# Patient Record
Sex: Female | Born: 1975 | Race: Black or African American | Hispanic: No | Marital: Single | Smoking: Current every day smoker
Health system: Southern US, Community
[De-identification: ages and names within clinical notes are randomized; demographics above are authoritative.]

## PROBLEM LIST (undated history)

## (undated) ENCOUNTER — Emergency Department (HOSPITAL_COMMUNITY): Admission: EM | Payer: Medicaid Other | Source: Home / Self Care

## (undated) DIAGNOSIS — J189 Pneumonia, unspecified organism: Secondary | ICD-10-CM

## (undated) DIAGNOSIS — I10 Essential (primary) hypertension: Secondary | ICD-10-CM

## (undated) DIAGNOSIS — J45909 Unspecified asthma, uncomplicated: Secondary | ICD-10-CM

## (undated) HISTORY — PX: KNEE SURGERY: SHX244

---

## 2010-10-10 HISTORY — PX: CHOLECYSTECTOMY: SHX55

## 2017-06-23 ENCOUNTER — Emergency Department (HOSPITAL_COMMUNITY)
Admission: EM | Admit: 2017-06-23 | Discharge: 2017-06-24 | Disposition: A | Discharge status: Home / Self Care | Payer: Self-pay | Attending: Emergency Medicine | Admitting: Emergency Medicine

## 2017-06-23 ENCOUNTER — Encounter (HOSPITAL_COMMUNITY): Payer: Self-pay

## 2017-06-23 DIAGNOSIS — Z3202 Encounter for pregnancy test, result negative: Secondary | ICD-10-CM | POA: Insufficient documentation

## 2017-06-23 DIAGNOSIS — Z79899 Other long term (current) drug therapy: Secondary | ICD-10-CM | POA: Insufficient documentation

## 2017-06-23 DIAGNOSIS — M545 Low back pain, unspecified: Secondary | ICD-10-CM

## 2017-06-23 DIAGNOSIS — Z91040 Latex allergy status: Secondary | ICD-10-CM | POA: Insufficient documentation

## 2017-06-23 DIAGNOSIS — I10 Essential (primary) hypertension: Secondary | ICD-10-CM | POA: Insufficient documentation

## 2017-06-23 DIAGNOSIS — R350 Frequency of micturition: Secondary | ICD-10-CM | POA: Insufficient documentation

## 2017-06-23 DIAGNOSIS — J45909 Unspecified asthma, uncomplicated: Secondary | ICD-10-CM | POA: Insufficient documentation

## 2017-06-23 HISTORY — DX: Unspecified asthma, uncomplicated: J45.909

## 2017-06-23 HISTORY — DX: Essential (primary) hypertension: I10

## 2017-06-23 LAB — URINALYSIS, ROUTINE W REFLEX MICROSCOPIC
BACTERIA UA: NONE SEEN
BILIRUBIN URINE: NEGATIVE
Glucose, UA: NEGATIVE mg/dL
Ketones, ur: NEGATIVE mg/dL
Leukocytes, UA: NEGATIVE
Nitrite: NEGATIVE
Protein, ur: NEGATIVE mg/dL
SPECIFIC GRAVITY, URINE: 1.021 (ref 1.005–1.030)
pH: 5 (ref 5.0–8.0)

## 2017-06-23 LAB — PREGNANCY, URINE: PREG TEST UR: NEGATIVE

## 2017-06-23 NOTE — ED Notes (Signed)
Pt informed urine sample is needed. 

## 2017-06-23 NOTE — ED Triage Notes (Addendum)
Pt reports lower back/bilateral flank pain pain x2 days.Has used tylenol to help with pain with no relief, but none taken today. Pt reports some urinary frequency, denies burning with urination.

## 2017-06-24 ENCOUNTER — Emergency Department (HOSPITAL_COMMUNITY): Payer: Self-pay

## 2017-06-24 LAB — BASIC METABOLIC PANEL
ANION GAP: 6 (ref 5–15)
BUN: 8 mg/dL (ref 6–20)
CALCIUM: 8.8 mg/dL — AB (ref 8.9–10.3)
CO2: 23 mmol/L (ref 22–32)
Chloride: 110 mmol/L (ref 101–111)
Creatinine, Ser: 0.66 mg/dL (ref 0.44–1.00)
Glucose, Bld: 100 mg/dL — ABNORMAL HIGH (ref 65–99)
POTASSIUM: 3.4 mmol/L — AB (ref 3.5–5.1)
Sodium: 139 mmol/L (ref 135–145)

## 2017-06-24 LAB — CBC WITH DIFFERENTIAL/PLATELET
BASOS ABS: 0 10*3/uL (ref 0.0–0.1)
BASOS PCT: 0 %
Eosinophils Absolute: 0.1 10*3/uL (ref 0.0–0.7)
Eosinophils Relative: 2 %
HEMATOCRIT: 33.6 % — AB (ref 36.0–46.0)
HEMOGLOBIN: 11 g/dL — AB (ref 12.0–15.0)
LYMPHS PCT: 59 %
Lymphs Abs: 3.9 10*3/uL (ref 0.7–4.0)
MCH: 23.5 pg — ABNORMAL LOW (ref 26.0–34.0)
MCHC: 32.7 g/dL (ref 30.0–36.0)
MCV: 71.6 fL — AB (ref 78.0–100.0)
Monocytes Absolute: 0.4 10*3/uL (ref 0.1–1.0)
Monocytes Relative: 6 %
NEUTROS ABS: 2.1 10*3/uL (ref 1.7–7.7)
NEUTROS PCT: 33 %
Platelets: 312 10*3/uL (ref 150–400)
RBC: 4.69 MIL/uL (ref 3.87–5.11)
RDW: 18 % — AB (ref 11.5–15.5)
WBC: 6.6 10*3/uL (ref 4.0–10.5)

## 2017-06-24 MED ORDER — CYCLOBENZAPRINE HCL 10 MG PO TABS: 10.0000 mg | ORAL_TABLET | Freq: Two times a day (BID) | ORAL | 0 refills | Status: DC | PRN

## 2017-06-24 MED ORDER — KETOROLAC TROMETHAMINE 30 MG/ML IJ SOLN
15.0000 mg | Freq: Once | INTRAMUSCULAR | Status: AC
  Administered 2017-06-24: 15 mg via INTRAMUSCULAR
  Filled 2017-06-24: qty 1

## 2017-06-24 MED ORDER — NAPROXEN 375 MG PO TABS: 375.0000 mg | ORAL_TABLET | Freq: Two times a day (BID) | ORAL | 0 refills | Status: DC

## 2017-06-24 NOTE — Discharge Instructions (Signed)
Your lab work is reassuring. Your urine does show some blood in it. Unsure of the etiology of this. Will need close follow-up with her PCP and urologist. No signs of infection at this time.  Your back pain may be due to muscle pain. Make sure you follow up with a pcp.   Please take the Naproxen as prescribed for pain. Do not take any additional NSAIDs including Motrin, Aleve, Ibuprofen, Advil.  Please take flexeril for muscle relaxation. This medication will make you drowsy so avoid situation that could place you in danger.

## 2017-06-24 NOTE — ED Notes (Signed)
Pt. Refused for CT scan, PA notified.

## 2017-06-24 NOTE — ED Provider Notes (Signed)
[facility]-EMERGENCY DEPT Provider Note   CSN: [id number] Arrival date & time: 06/23/17  2154     History   Chief Complaint Chief Complaint  Patient presents with  . Back Pain    HPI Sheila Garner is a 41 y.o. female.  HPI 41 year old African-American female past medical history significant for hypertension presents to the emergency department today with complaints of low back pain. The patient states that her pain has been ongoing for "months" but acutely worsened over the past 2-3 days. States the pain is worse with movement. States the pain was only on her left side but now has radiated to her right side. Patient reports some mild increased urinary frequency but denies any dysuria, hematuria, urgency. The patient states that she was seen by her primary care doctor several months ago for same. [place] At that time there referred her to a "kidney specialist". The patient states that she has not followed up with them. The patient states that she does sit a lot for her job. Denies any heavy lifting or known injury. Pt denies any ha, night sweats, hx of ivdu/cancer, loss or bowel or bladder, urinary retention, saddle paresthesias, lower extremity paresthesias. Patient has tried Tylenol for her pain with little relief. Moving makes the pain worse. Nothing makes the pain better. She has not taken any for pain prior to arrival today.  Pt states she does have a history of hematuria.  Pt denies any fever, chill, ha, vision changes, lightheadedness, dizziness, congestion, neck pain, cp, sob, cough, abd pain, n/v/d, vaginal bleeding, vaginal discharge, change in bowel habits, melena, hematochezia, lower extremity paresthesias.   Past Medical History:  Diagnosis Date  . Asthma   . Hypertension     There are no active problems to display for this patient.   Past Surgical History:  Procedure Laterality Date  . CHOLECYSTECTOMY  2012    OB History    No data available        Home Medications    Prior to Admission medications   Medication Sig Start Date End Date Taking? Authorizing Provider  cyclobenzaprine (FLEXERIL) 10 MG tablet Take 1 tablet (10 mg total) by mouth 2 (two) times daily as needed for muscle spasms. 06/24/17   Rise Mu, PA-C  naproxen (NAPROSYN) 375 MG tablet Take 1 tablet (375 mg total) by mouth 2 (two) times daily. 06/24/17   Rise Mu, PA-C    Family History No family history on file.  Social History Social History  Substance Use Topics  . Smoking status: Never Smoker  . Smokeless tobacco: Never Used  . Alcohol use Yes     Comment: socially     Allergies   Latex   Review of Systems Review of Systems  Constitutional: Negative for chills and fever.  HENT: Negative for congestion.   Eyes: Negative for visual disturbance.  Respiratory: Negative for cough and shortness of breath.   Cardiovascular: Negative for chest pain.  Gastrointestinal: Negative for abdominal pain, diarrhea, nausea and vomiting.  Genitourinary: Positive for frequency. Negative for dysuria, flank pain, hematuria, urgency, vaginal bleeding and vaginal discharge.  Musculoskeletal: Positive for back pain. Negative for arthralgias and myalgias.  Skin: Negative for rash.  Neurological: Negative for dizziness, syncope, weakness, light-headedness, numbness and headaches.  Psychiatric/Behavioral: Negative for sleep disturbance. The patient is not nervous/anxious.      Physical Exam Updated Vital Signs BP (!) 142/97 (BP Location: Right Arm)   Pulse 96   Temp  98.1 F (36.7 C) (Oral)   Resp 17   Ht 5' 3.5" (1.613 m)   LMP 06/06/2017 (Approximate) Comment: negative pregnancy test result 06-23-17  SpO2 99%   Physical Exam  Constitutional: She is oriented to person, place, and time. She appears well-developed and well-nourished.  Non-toxic appearance. No distress.  HENT:  Head: Normocephalic and atraumatic.  Nose: Nose normal.   Mouth/Throat: Oropharynx is clear and moist.  Eyes: Pupils are equal, round, and reactive to light. Conjunctivae are normal. Right eye exhibits no discharge. Left eye exhibits no discharge.  Neck: Normal range of motion. Neck supple.  Cardiovascular: Normal rate, regular rhythm, normal heart sounds and intact distal pulses.   Pulmonary/Chest: Effort normal and breath sounds normal. No respiratory distress. She exhibits no tenderness.  Abdominal: Soft. Bowel sounds are normal. There is no tenderness. There is no rigidity, no rebound, no guarding, no CVA tenderness, no tenderness at McBurney's point and negative Murphy's sign.  Musculoskeletal: Normal range of motion. She exhibits no tenderness.  No midline T spine or L spine tenderness. No deformities or step offs noted. Full ROM. Pelvis is stable.  Bilateral lumbar paraspinal tenderness.   Lymphadenopathy:    She has no cervical adenopathy.  Neurological: She is alert and oriented to person, place, and time.  Skin: Skin is warm and dry. Capillary refill takes less than 2 seconds.  Psychiatric: Her behavior is normal. Judgment and thought content normal.  Nursing note and vitals reviewed.    ED Treatments / Results  Labs (all labs ordered are listed, but only abnormal results are displayed) Labs Reviewed  URINALYSIS, ROUTINE W REFLEX MICROSCOPIC - Abnormal; Notable for the following:       Result Value   Hgb urine dipstick LARGE (*)    Squamous Epithelial / LPF 0-5 (*)    All other components within normal limits  BASIC METABOLIC PANEL - Abnormal; Notable for the following:    Potassium 3.4 (*)    Glucose, Bld 100 (*)    Calcium 8.8 (*)    All other components within normal limits  CBC WITH DIFFERENTIAL/PLATELET - Abnormal; Notable for the following:    Hemoglobin 11.0 (*)    HCT 33.6 (*)    MCV 71.6 (*)    MCH 23.5 (*)    RDW 18.0 (*)    All other components within normal limits  URINE CULTURE  PREGNANCY, URINE    EKG   EKG Interpretation None       Radiology No results found.  Procedures Procedures (including critical care time)  Medications Ordered in ED Medications  ketorolac (TORADOL) 30 MG/ML injection 15 mg (15 mg Intramuscular Given 06/24/17 0051)     Initial Impression / Assessment and Plan / ED Course  I have reviewed the triage vital signs and the nursing notes.  Pertinent labs & imaging results that were available during my care of the patient were reviewed by me and considered in my medical decision making (see chart for details).     Patient presents to the emergency Department today with complaints of low back pain. Patient states that her pain has been ongoing for months and she has been seen by her primary care doctor for same in the past. They recommended follow-up with the urologist which she has failed to do so. Patient does report history of hematuria. Patient reports some mild intermittent urinary frequency but denies any specific urinary symptoms at this time. She denies any associated fever, chills, nausea, vomiting, abdominal  pain, vaginal bleeding, vaginal discharge. Patient denies any red flag symptoms. Denies any recent illness.  Overall patient is well-appearing and nontoxic. Vital signs are reassuring. Patient is afebrile, no tachycardia noted. On exam patient does have some mild tenderness to palpation of the lumbar paraspinal region. No midline tenderness. No specific CVA tenderness. Abdominal exam is benign.  Labwork was obtained. No leukocytosis noted. Mild anemia with hemoglobin of 11. Kidney function is normal. Pregnant test is negative. UA shows large rbc, but shows no bacteria, WBCs or leukocytes or nitrite. No protein. We'll send for culture however do not believe this to be treated at this time. Concern for possible nephrolithiasis. Patient states she has history of same.  Have provided patient Toradol in the ED and she feels improved. Patient refused renal CT  scan. States that she will follow-up with her primary care doctor and urologist. The patient's symptoms could also be due to musculoskeletal injury. We'll treat with anti-inflammatories and muscle relaxers. Low suspicion for pyelonephritis, renal infarct.  Encourage patient to have close follow-up with her PCP and neurologist. Given a normal physical exam and reassuring lab work for the patient is safe for discharge at this time.  Pt is hemodynamically stable, in NAD, & able to ambulate in the ED. Evaluation does not show pathology that would require ongoing emergent intervention or inpatient treatment. I explained the diagnosis to the patient. Pain has been managed & has no complaints prior to dc. Pt is comfortable with above plan and is stable for discharge at this time. All questions were answered prior to disposition. Strict return precautions for f/u to the ED were discussed. Encouraged follow up with PCP.    Final Clinical Impressions(s) / ED Diagnoses   Final diagnoses:  Acute bilateral low back pain without sciatica    New Prescriptions Discharge Medication List as of 06/24/2017  1:58 AM    START taking these medications   Details  cyclobenzaprine (FLEXERIL) 10 MG tablet Take 1 tablet (10 mg total) by mouth 2 (two) times daily as needed for muscle spasms., Starting Sat 06/24/2017, Print    naproxen (NAPROSYN) 375 MG tablet Take 1 tablet (375 mg total) by mouth 2 (two) times daily., Starting Sat 06/24/2017, Print         Rise Mu, PA-C 06/24/17 9604    Dione Booze, MD 06/24/17 [phone]

## 2017-06-26 LAB — URINE CULTURE: CULTURE: NO GROWTH

## 2017-09-01 ENCOUNTER — Other Ambulatory Visit: Payer: Self-pay

## 2017-09-01 ENCOUNTER — Encounter (HOSPITAL_COMMUNITY): Payer: Self-pay

## 2017-09-01 ENCOUNTER — Emergency Department (HOSPITAL_COMMUNITY)
Admission: EM | Admit: 2017-09-01 | Discharge: 2017-09-01 | Disposition: A | Discharge status: Home / Self Care | Payer: Medicaid Other | Attending: Emergency Medicine | Admitting: Emergency Medicine

## 2017-09-01 DIAGNOSIS — Z91040 Latex allergy status: Secondary | ICD-10-CM | POA: Insufficient documentation

## 2017-09-01 DIAGNOSIS — J029 Acute pharyngitis, unspecified: Secondary | ICD-10-CM | POA: Diagnosis not present

## 2017-09-01 DIAGNOSIS — Z9049 Acquired absence of other specified parts of digestive tract: Secondary | ICD-10-CM | POA: Diagnosis not present

## 2017-09-01 DIAGNOSIS — I10 Essential (primary) hypertension: Secondary | ICD-10-CM | POA: Insufficient documentation

## 2017-09-01 DIAGNOSIS — Z79899 Other long term (current) drug therapy: Secondary | ICD-10-CM | POA: Insufficient documentation

## 2017-09-01 DIAGNOSIS — F17210 Nicotine dependence, cigarettes, uncomplicated: Secondary | ICD-10-CM | POA: Insufficient documentation

## 2017-09-01 DIAGNOSIS — J45909 Unspecified asthma, uncomplicated: Secondary | ICD-10-CM | POA: Diagnosis not present

## 2017-09-01 LAB — RAPID STREP SCREEN (MED CTR MEBANE ONLY): Streptococcus, Group A Screen (Direct): NEGATIVE

## 2017-09-01 NOTE — ED Triage Notes (Signed)
Pt reports she has been having sore throat, headache, and body aches X3 days. She reports using Theraflu and tylenol which stopped chills and aches but states she still has a sore throat with Popovich spots.

## 2017-09-01 NOTE — Discharge Instructions (Signed)
Please read attached information. If you experience any new or worsening signs or symptoms please return to the emergency room for evaluation. Please follow-up with your primary care provider or specialist as discussed.  °

## 2017-09-01 NOTE — ED Provider Notes (Signed)
[facility]   CSN: [id number] Arrival date & time: 09/01/17  0932     History   Chief Complaint Chief Complaint  Patient presents with  . Sore Throat    HPI Sheila DupontMelinda Crosby is a 41 y.o. female.  HPI   41 year old female presents today with complaints of sore throat.  Patient reports 3 days ago she developed body aches, sore throat and fatigue.  She notes taking TheraFlu which seemed to improve her symptoms, denies any fever at that time.  Patient notes continued sore throat with "[place] spots" on her tonsils.  Patient denies any productive cough, denies any fever, denies any other systemic symptoms.  Patient has been using cough drops at home without significant improvement.  Past Medical History:  Diagnosis Date  . Asthma   . Hypertension     There are no active problems to display for this patient.   Past Surgical History:  Procedure Laterality Date  . CHOLECYSTECTOMY  2012    OB History    No data available       Home Medications    Prior to Admission medications   Medication Sig Start Date End Date Taking? Authorizing Provider  cyclobenzaprine (FLEXERIL) 10 MG tablet Take 1 tablet (10 mg total) by mouth 2 (two) times daily as needed for muscle spasms. 06/24/17   Rise MuLeaphart, Kenneth T, PA-C  naproxen (NAPROSYN) 375 MG tablet Take 1 tablet (375 mg total) by mouth 2 (two) times daily. 06/24/17   Rise MuLeaphart, Kenneth T, PA-C    Family History No family history on file.  Social History Social History   Tobacco Use  . Smoking status: Current Every Day Smoker    Packs/day: 0.50    Years: 15.00    Pack years: 7.50    Types: Cigarettes  . Smokeless tobacco: Never Used  Substance Use Topics  . Alcohol use: Yes    Comment: socially  . Drug use: No     Allergies   Latex   Review of Systems Review of Systems  All other systems reviewed and are negative.  Physical Exam Updated Vital Signs BP (!) 157/106  (BP Location: Right Arm)   Pulse 79   Temp 98.5 F (36.9 C) (Oral)   Resp 16   Ht 5\' 4"  (1.626 m)   Wt 77.1 kg (170 lb)   LMP 08/18/2017   SpO2 100%   BMI 29.18 kg/m   Physical Exam  Constitutional: She is oriented to person, place, and time. She appears well-developed and well-nourished.  HENT:  Head: Normocephalic and atraumatic.  Mouth/Throat: Uvula is midline, oropharynx is clear and moist and mucous membranes are normal. No oropharyngeal exudate, posterior oropharyngeal edema or posterior oropharyngeal erythema. Tonsils are 0 on the right. Tonsils are 0 on the left. No tonsillar exudate.  No tender lymphadenopathy  Eyes: Conjunctivae are normal. Pupils are equal, round, and reactive to light. Right eye exhibits no discharge. Left eye exhibits no discharge. No scleral icterus.  Neck: Normal range of motion. No JVD present. No tracheal deviation present.  Pulmonary/Chest: Effort normal. No stridor.  Neurological: She is alert and oriented to person, place, and time. Coordination normal.  Psychiatric: She has a normal mood and affect. Her behavior is normal. Judgment and thought content normal.  Nursing note and vitals reviewed.    ED Treatments / Results  Labs (all labs ordered are listed, but only abnormal results are displayed) Labs Reviewed  RAPID STREP SCREEN (NOT AT  [facility])  CULTURE, GROUP A STREP [facility])    EKG  EKG Interpretation None       Radiology No results found.  Procedures Procedures (including critical care time)  Medications Ordered in ED Medications - No data to display   Initial Impression / Assessment and Plan / ED Course  I have reviewed the triage vital signs and the nursing notes.  Pertinent labs & imaging results that were available during my care of the patient were reviewed by me and considered in my medical decision making (see chart for details).     Final Clinical Impressions(s) / ED Diagnoses   Final diagnoses:  Pharyngitis,  unspecified etiology    Labs: rapid Strep  Imaging:  Consults:  Therapeutics:  Discharge Meds:   Assessment/Plan: 41 year old female presents today with likely viral pharyngitis.  She has a very reassuring workup.  Negative strep, no signs of infectious etiology within the oropharynx.  Patient is afebrile tolerating p.o. with no signs of significant infection.  She will be discharged with symptomatic care instructions and strict return precautions.  She verbalized understanding and agreement to today's plan.    ED Discharge Orders    None       Rosalio LoudHedges, Zoii Florer, PA-C 09/01/17 1157    Raeford RazorKohut, Stephen, MD 09/01/17 [phone]

## 2017-09-03 LAB — CULTURE, GROUP A STREP (THRC)

## 2017-09-06 ENCOUNTER — Other Ambulatory Visit: Payer: Self-pay

## 2017-09-06 ENCOUNTER — Encounter (HOSPITAL_COMMUNITY): Payer: Self-pay | Admitting: Emergency Medicine

## 2017-09-06 ENCOUNTER — Ambulatory Visit (HOSPITAL_COMMUNITY)
Admission: EM | Admit: 2017-09-06 | Discharge: 2017-09-06 | Disposition: A | Discharge status: Home / Self Care | Payer: Medicaid Other | Attending: Family Medicine | Admitting: Family Medicine

## 2017-09-06 DIAGNOSIS — J0100 Acute maxillary sinusitis, unspecified: Secondary | ICD-10-CM | POA: Diagnosis not present

## 2017-09-06 DIAGNOSIS — R059 Cough, unspecified: Secondary | ICD-10-CM

## 2017-09-06 DIAGNOSIS — R05 Cough: Secondary | ICD-10-CM | POA: Diagnosis not present

## 2017-09-06 DIAGNOSIS — I10 Essential (primary) hypertension: Secondary | ICD-10-CM

## 2017-09-06 MED ORDER — LISINOPRIL 10 MG PO TABS: 10.0000 mg | ORAL_TABLET | Freq: Every day | ORAL | 1 refills | Status: DC

## 2017-09-06 MED ORDER — FLUCONAZOLE 150 MG PO TABS: 150.0000 mg | ORAL_TABLET | Freq: Once | ORAL | 0 refills | Status: AC

## 2017-09-06 MED ORDER — BENZONATATE 100 MG PO CAPS: 100.0000 mg | ORAL_CAPSULE | Freq: Three times a day (TID) | ORAL | 0 refills | Status: DC | PRN

## 2017-09-06 MED ORDER — AMOXICILLIN 875 MG PO TABS: 875.0000 mg | ORAL_TABLET | Freq: Two times a day (BID) | ORAL | 0 refills | Status: DC

## 2017-09-06 NOTE — Discharge Instructions (Signed)
[facility] Contact:  [employer] at [phone]

## 2017-09-06 NOTE — ED Triage Notes (Addendum)
Pt reports a sore throat, sinus congestion, cough and right ear pain for one week.  Pt was seen in the ED 5 days ago and she had a negative Strep Culture.  No medications were given.  Pt would like a refill on her Lisinopril 10 mg Daily.

## 2017-09-06 NOTE — ED Provider Notes (Addendum)
[facility]   [id number] 09/06/17 Arrival Time: 1300   SUBJECTIVE:  Terrence DupontMelinda Krage is a 41 y.o. female who presents to the urgent care with complaint of sore throat, sinus congestion, cough and right ear pain for one week. Pt was seen in the ED 5 days ago and she had a negative Strep Culture. No medications were given.  Pt would like a refill on her Lisinopril 10 mg Daily.  No chest pain.  No shortness of breath.  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 Asthma   . Hypertension    Family History  Problem Relation Age of Onset  . Heart failure Mother   . Diabetes Mother   . Hypertension Mother   . Diabetes Father   . Hypertension Father   . Stroke Father    Social History   Socioeconomic History  . Marital status: Single    Spouse name: Not on file  . Number of children: Not on file  . Years of education: Not on file  . Highest education level: Not on file  Social Needs  . Financial resource strain: Not on file  . Food insecurity - worry: Not on file  . Food insecurity - inability: Not on file  . Transportation needs - medical: Not on file  . Transportation needs - non-medical: Not on file  Occupational History  . Not on file  Tobacco Use  . Smoking status: Current Every Day Smoker    Packs/day: 0.25    Years: 15.00    Pack years: 3.75    Types: Cigarettes  . Smokeless tobacco: Never Used  Substance and Sexual Activity  . Alcohol use: Yes    Comment: socially  . Drug use: No  . Sexual activity: No  Other Topics Concern  . Not on file  Social History Narrative  . Not on file   Current Meds  Medication Sig  . [DISCONTINUED] lisinopril (PRINIVIL,ZESTRIL) 10 MG tablet Take 10 mg by mouth daily.   Allergies  Allergen Reactions  . Latex Rash      ROS: As per HPI, remainder of ROS negative.   OBJECTIVE:   Vitals:   09/06/17 1318  BP: (!) 153/101  Pulse: 88  Temp: 98.2 F  (36.8 C)  TempSrc: Oral  SpO2: 100%     General appearance: alert; no distress Eyes: PERRL; EOMI; conjunctiva normal HENT: normocephalic; atraumatic; TMs normal, canal normal, external ears normal without trauma; nasal mucosa normal; oral mucosa normal Neck: supple Lungs: clear to auscultation bilaterally Heart: regular rate and rhythm Back: no CVA tenderness Extremities: no cyanosis or edema; symmetrical with no gross deformities Skin: warm and dry Neurologic: normal gait; grossly normal Psychological: alert and cooperative; normal mood and affect      Labs:  Results for orders placed or performed during the hospital encounter of 09/01/17  Rapid strep screen  Result Value Ref Range   Streptococcus, Group A Screen (Direct) NEGATIVE NEGATIVE  Culture, group A strep  Result Value Ref Range   Specimen Description THROAT    Special Requests NONE Reflexed from [id number]    Culture NO GROUP A STREP (S.PYOGENES) ISOLATED    Report Status 09/03/2017 FINAL     Labs Reviewed - No data to display  No results found.     ASSESSMENT & PLAN:  1. Acute maxillary sinusitis, recurrence not specified   2. Essential hypertension   3. Cough  Meds ordered this encounter  Medications  . benzonatate (TESSALON) 100 MG capsule    Sig: Take 1-2 capsules (100-200 mg total) by mouth 3 (three) times daily as needed for cough.    Dispense:  20 capsule    Refill:  0  . amoxicillin (AMOXIL) 875 MG tablet    Sig: Take 1 tablet (875 mg total) by mouth 2 (two) times daily.    Dispense:  20 tablet    Refill:  0  . lisinopril (PRINIVIL,ZESTRIL) 10 MG tablet    Sig: Take 1 tablet (10 mg total) by mouth daily.    Dispense:  90 tablet    Refill:  1  . fluconazole (DIFLUCAN) 150 MG tablet    Sig: Take 1 tablet (150 mg total) by mouth once for 1 dose. Repeat if needed    Dispense:  2 tablet    Refill:  0    Reviewed expectations re: course of current medical issues. Questions  answered. Outlined signs and symptoms indicating need for more acute intervention. Patient verbalized understanding. After Visit Summary given.    Procedures:      Elvina SidleLauenstein, Karysa Heft, MD 09/06/17 1341    Elvina SidleLauenstein, Timberlyn Pickford, MD 09/06/17 1350

## 2018-04-03 ENCOUNTER — Ambulatory Visit (INDEPENDENT_AMBULATORY_CARE_PROVIDER_SITE_OTHER): Payer: No Typology Code available for payment source | Admitting: Orthopaedic Surgery

## 2018-04-03 ENCOUNTER — Telehealth (INDEPENDENT_AMBULATORY_CARE_PROVIDER_SITE_OTHER): Payer: Self-pay

## 2018-04-03 DIAGNOSIS — S63656A Sprain of metacarpophalangeal joint of right little finger, initial encounter: Secondary | ICD-10-CM

## 2018-04-03 MED ORDER — MELOXICAM 7.5 MG PO TABS: 7.5000 mg | ORAL_TABLET | Freq: Every day | ORAL | 2 refills | Status: DC | PRN
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
## 2018-04-03 NOTE — Telephone Encounter (Signed)
Please see the order placed in the referral queue for physical therapy for this worker's comp patient.

## 2018-04-04 NOTE — Telephone Encounter (Signed)
Faxed PT rx and yesterdays office note to w[facility] adj Wilhemina CashKymberle Wachter Fax#[phone]  See referral

## 2018-04-06 ENCOUNTER — Telehealth (INDEPENDENT_AMBULATORY_CARE_PROVIDER_SITE_OTHER): Payer: Self-pay | Admitting: Physician Assistant

## 2018-04-06 NOTE — Telephone Encounter (Signed)
OV note faxed to nurse case manager Amil AmenJulie Mullins @ [phone]

## 2018-04-06 NOTE — Telephone Encounter (Signed)
Amil AmenJulie Mullins, Nurse Case Manager is needing the work status. Please fax note to [phone].

## 2018-04-09 ENCOUNTER — Encounter: Payer: Self-pay | Admitting: Emergency Medicine

## 2018-04-09 ENCOUNTER — Emergency Department
Admission: EM | Admit: 2018-04-09 | Discharge: 2018-04-10 | Disposition: A | Discharge status: Home / Self Care | Payer: Medicaid Other | Attending: Emergency Medicine | Admitting: Emergency Medicine

## 2018-04-09 DIAGNOSIS — Z79899 Other long term (current) drug therapy: Secondary | ICD-10-CM | POA: Diagnosis not present

## 2018-04-09 DIAGNOSIS — I10 Essential (primary) hypertension: Secondary | ICD-10-CM | POA: Diagnosis not present

## 2018-04-09 DIAGNOSIS — Z9049 Acquired absence of other specified parts of digestive tract: Secondary | ICD-10-CM | POA: Diagnosis not present

## 2018-04-09 DIAGNOSIS — F17210 Nicotine dependence, cigarettes, uncomplicated: Secondary | ICD-10-CM | POA: Diagnosis not present

## 2018-04-09 DIAGNOSIS — R51 Headache: Secondary | ICD-10-CM | POA: Diagnosis present

## 2018-04-09 DIAGNOSIS — Z91040 Latex allergy status: Secondary | ICD-10-CM | POA: Diagnosis not present

## 2018-04-09 DIAGNOSIS — G43809 Other migraine, not intractable, without status migrainosus: Secondary | ICD-10-CM | POA: Diagnosis not present

## 2018-04-09 DIAGNOSIS — J45909 Unspecified asthma, uncomplicated: Secondary | ICD-10-CM | POA: Diagnosis not present

## 2018-04-09 MED ORDER — DIPHENHYDRAMINE HCL 50 MG/ML IJ SOLN
25.0000 mg | Freq: Once | INTRAMUSCULAR | Status: AC
  Administered 2018-04-10: 25 mg via INTRAVENOUS
  Filled 2018-04-09: qty 1

## 2018-04-09 MED ORDER — METOCLOPRAMIDE HCL 5 MG/ML IJ SOLN
10.0000 mg | Freq: Once | INTRAMUSCULAR | Status: AC
  Administered 2018-04-10: 10 mg via INTRAVENOUS
  Filled 2018-04-09: qty 2

## 2018-04-09 MED ORDER — SUMATRIPTAN SUCCINATE 6 MG/0.5ML ~~LOC~~ SOLN
6.0000 mg | Freq: Once | SUBCUTANEOUS | Status: AC
  Administered 2018-04-10: 6 mg via SUBCUTANEOUS
  Filled 2018-04-09: qty 0.5

## 2018-04-09 MED ORDER — KETOROLAC TROMETHAMINE 30 MG/ML IJ SOLN
30.0000 mg | Freq: Once | INTRAMUSCULAR | Status: AC
  Administered 2018-04-10: 30 mg via INTRAVENOUS
  Filled 2018-04-09: qty 1

## 2018-04-09 NOTE — Telephone Encounter (Signed)
What is patient's work status?  

## 2018-04-09 NOTE — ED Notes (Signed)
Pt reports migraine that started this morning. Reports last migraine was 6 months ago. VSS. Pt denies CP or SOB.

## 2018-04-09 NOTE — ED Triage Notes (Signed)
Pt c/o migraine since this AM. Pt has hx/o and stopped taking medication 6 months ago since pt had ear piercing that has relieved symptoms until today.

## 2018-04-09 NOTE — ED Provider Notes (Signed)
[facility] Emergency Department Provider Note   First MD Initiated Contact with Patient 04/09/18 2344     (approximate)  I have reviewed the triage vital signs and the nursing notes.   HISTORY  Chief Complaint Migraine    HPI Sheila Garner is a 42 y.o. female with history of migraines presents to the emergency department with a history of migraine consistent with previous migraine headaches x2 hours.  Patient denies any weakness numbness gait instability or visual changes.  Patient states current pain score is 10 out of 10.  Patient states positive nausea and photophobia.  Past Medical History:  Diagnosis Date  . Asthma   . Hypertension     Patient Active Problem List   Diagnosis Date Noted  . Sprain of metacarpophalangeal joint of right little finger 04/03/2018    Past Surgical History:  Procedure Laterality Date  . CHOLECYSTECTOMY  2012  . KNEE SURGERY Left     Prior to Admission medications   Medication Sig Start Date End Date Taking? Authorizing Provider  amoxicillin (AMOXIL) 875 MG tablet Take 1 tablet (875 mg total) by mouth 2 (two) times daily. 09/06/17   Elvina Sidle, MD  benzonatate (TESSALON) 100 MG capsule Take 1-2 capsules (100-200 mg total) by mouth 3 (three) times daily as needed for cough. 09/06/17   Elvina Sidle, MD  lisinopril (PRINIVIL,ZESTRIL) 10 MG tablet Take 1 tablet (10 mg total) by mouth daily. 09/06/17   Elvina Sidle, MD  meloxicam (MOBIC) 7.5 MG tablet TK 1 T PO BID 03/02/18   [provider]  meloxicam (MOBIC) 7.5 MG tablet Take 1 tablet (7.5 mg total) by mouth daily as needed for up to 30 doses for pain. 04/03/18   Cristie Hem, PA-C  ondansetron (ZOFRAN-ODT) 4 MG disintegrating tablet Take by mouth. 10/08/16   [provider]    Allergies Latex  Family History  Problem Relation Age of Onset  . Heart failure Mother   . Diabetes Mother   . Hypertension Mother   . Diabetes Father    . Hypertension Father   . Stroke Father     Social History Social History   Tobacco Use  . Smoking status: Current Every Day Smoker    Packs/day: 0.25    Years: 15.00    Pack years: 3.75    Types: Cigarettes  . Smokeless tobacco: Never Used  Substance Use Topics  . Alcohol use: Yes    Comment: socially  . Drug use: No    Review of Systems Constitutional: No fever/chills Eyes: No visual changes. ENT: No sore throat. Cardiovascular: Denies chest pain. Respiratory: Denies shortness of breath. Gastrointestinal: No abdominal pain.  No nausea, no vomiting.  No diarrhea.  No constipation. Genitourinary: Negative for dysuria. Musculoskeletal: Negative for neck pain.  Negative for back pain. Integumentary: Negative for rash. Neurological: Positive or headaches, negative focal weakness or numbness.   ____________________________________________   PHYSICAL EXAM:  VITAL SIGNS: ED Triage Vitals [04/09/18 2243]  Enc Vitals Group     BP (!) 158/95     Pulse Rate 86     Resp 17     Temp 98 F (36.7 C)     Temp Source Oral     SpO2 100 %     Weight 77.1 kg (170 lb)     Height      Head Circumference      Peak Flow      Pain Score 8     Pain  Loc      Pain Edu?      Excl. in GC?     Constitutional: Alert and oriented. Well appearing and in no acute distress. Eyes: Conjunctivae are normal. PERRL. EOMI. Head: Atraumatic. Mouth/Throat: Mucous membranes are moist.{**  Oropharynx non-erythematous. Neck: No stridor.   Cardiovascular: Normal rate, regular rhythm. Good peripheral circulation. Grossly normal heart sounds. Respiratory: Normal respiratory effort.  No retractions. Lungs CTAB. Gastrointestinal: Soft and nontender. No distention.  Musculoskeletal: No lower extremity tenderness nor edema. No gross deformities of extremities. Neurologic:  Normal speech and language. No gross focal neurologic deficits are appreciated.  Skin:  Skin is warm, dry and intact. No rash  noted. Psychiatric: Mood and affect are normal. Speech and behavior are normal.     Procedures   ____________________________________________   INITIAL IMPRESSION / ASSESSMENT AND PLAN / ED COURSE  As part of my medical decision making, I reviewed the following data within the electronic MEDICAL RECORD NUMBER   42 year old female presenting with above-stated history physical exam secondary to migraine headache.  Patient given IV Toradol Benadryl and Reglan in addition patient given Imitrex 6 mg subcu.  On reevaluation patient states that pain is much improved.  Patient states that she was prescribed Maxalt and Topamax but has ran out and as such these prescriptions were given recommendation for follow-up. ____________________________________________  FINAL CLINICAL IMPRESSION(S) / ED DIAGNOSES  Final diagnoses:  Other migraine without status migrainosus, not intractable     MEDICATIONS GIVEN DURING THIS VISIT:  Medications - No data to display   ED Discharge Orders    None       Note:  This document was prepared using Dragon voice recognition software and may include unintentional dictation errors.    Darci CurrentBrown, [place] N, MD 04/10/18 [phone]

## 2018-04-09 NOTE — Telephone Encounter (Signed)
No repetitive use of the hand.  No lifting more than 5 lbs

## 2018-04-10 ENCOUNTER — Encounter (INDEPENDENT_AMBULATORY_CARE_PROVIDER_SITE_OTHER): Payer: Self-pay

## 2018-04-10 MED ORDER — RIZATRIPTAN BENZOATE 10 MG PO TABS: 10.0000 mg | ORAL_TABLET | Freq: Once | ORAL | 0 refills | Status: DC | PRN

## 2018-04-10 MED ORDER — TOPIRAMATE 25 MG PO TABS: 25.0000 mg | ORAL_TABLET | Freq: Every day | ORAL | 0 refills | Status: DC

## 2018-04-10 NOTE — Telephone Encounter (Signed)
Note made and faxed to Amil AmenJulie Mullins

## 2018-04-11 ENCOUNTER — Telehealth (INDEPENDENT_AMBULATORY_CARE_PROVIDER_SITE_OTHER): Payer: Self-pay | Admitting: Orthopaedic Surgery

## 2018-04-11 NOTE — Telephone Encounter (Signed)
FAXED

## 2018-04-11 NOTE — Telephone Encounter (Signed)
Beth from [facility] called requesting recent office notes to be faxed to:  [phone]

## 2018-04-25 ENCOUNTER — Telehealth (INDEPENDENT_AMBULATORY_CARE_PROVIDER_SITE_OTHER): Payer: Self-pay | Admitting: Orthopaedic Surgery

## 2018-04-25 NOTE — Telephone Encounter (Signed)
Beth from [facility] called needing order faxed to them for (OT) to eval and treat patient. The fax# is [phone]  The phone # is [phone]

## 2018-04-25 NOTE — Telephone Encounter (Signed)
[facility]

## 2018-05-08 ENCOUNTER — Ambulatory Visit (INDEPENDENT_AMBULATORY_CARE_PROVIDER_SITE_OTHER): Payer: No Typology Code available for payment source

## 2018-05-08 ENCOUNTER — Ambulatory Visit (INDEPENDENT_AMBULATORY_CARE_PROVIDER_SITE_OTHER): Payer: No Typology Code available for payment source | Admitting: Orthopaedic Surgery

## 2018-05-08 ENCOUNTER — Encounter (INDEPENDENT_AMBULATORY_CARE_PROVIDER_SITE_OTHER): Payer: Self-pay | Admitting: Orthopaedic Surgery

## 2018-05-08 DIAGNOSIS — S63657A Sprain of metacarpophalangeal joint of left little finger, initial encounter: Secondary | ICD-10-CM

## 2018-05-08 DIAGNOSIS — S63656A Sprain of metacarpophalangeal joint of right little finger, initial encounter: Secondary | ICD-10-CM

## 2018-05-08 NOTE — Progress Notes (Signed)
   Office Visit Note   Patient: Sheila Garner           Date of Birth: [birth year]           MRN: [id number] Visit Date: 05/08/2018              Requested by: No referring provider defined for this encounter. PCP: Patient, No Pcp Per   Assessment & Plan: Visit Diagnoses:  1. Sprain of metacarpophalangeal (MCP) joint of right little finger, initial encounter     Plan: Impression is improving left small finger MCP sprain.  She is doing well at work she is able to do her full duty.  I recommend continue with hand therapy as she is making progress and she is improving.  From my standpoint she can continue working.  I will see her back in another 4 weeks for recheck.  Follow-Up Instructions: Return in about 1 month (around 06/05/2018).   Orders:  Orders Placed This Encounter  Procedures  . XR Hand Complete Left   No orders of the defined types were placed in this encounter.     Procedures: No procedures performed   Clinical Data: No additional findings.   Subjective: Chief Complaint  Patient presents with  . Left Hand - Pain, Follow-up     Today for her left hand pain.  She is overall doing well.  She is currently working as a Naval architecttruck driver.  Tylenol helps with the pain.   Review of Systems   Objective: Vital Signs: There were no vitals taken for this visit.  Physical Exam  Ortho Exam Left hand exam shows no swelling or palpable defects.  She is able to make a full composite fist.   Specialty Comments:  No specialty comments available.  Imaging: Xr Hand Complete Left  Result Date: 05/08/2018 No acute or structural abnormalities.    PMFS History: Patient Active Problem List   Diagnosis Date Noted  . Sprain of metacarpophalangeal joint of right little finger 04/03/2018   Past Medical History:  Diagnosis Date  . Asthma   . Hypertension     Family History  Problem Relation Age of Onset  . Heart failure Mother   . Diabetes Mother   . Hypertension  Mother   . Diabetes Father   . Hypertension Father   . Stroke Father     Past Surgical History:  Procedure Laterality Date  . CHOLECYSTECTOMY  2012  . KNEE SURGERY Left    Social History   Occupational History  . Not on file  Tobacco Use  . Smoking status: Current Every Day Smoker    Packs/day: 0.25    Years: 15.00    Pack years: 3.75    Types: Cigarettes  . Smokeless tobacco: Never Used  Substance and Sexual Activity  . Alcohol use: Yes    Comment: socially  . Drug use: No  . Sexual activity: Never

## 2018-05-08 NOTE — Progress Notes (Signed)
   Office Visit Note   Patient: Sheila DupontMelinda Hieronymus           Date of Birth: 12-13-1975           MRN: [id number] Visit Date: 05/08/2018              Requested by: No referring provider defined for this encounter. PCP: Patient, No Pcp Per   Assessment & Plan: Visit Diagnoses:  1. Sprain of metacarpophalangeal (MCP) joint of right little finger, initial encounter     Plan: 5th MCP joint sprain. Progressing appropriately at this time with hand therapy. She will continue with hand therapy for 4 more weeks and follow up at that time in clinic. She may continue to work without restrictions.  Follow-Up Instructions: Return in about 1 month (around 06/05/2018).   Orders:  Orders Placed This Encounter  Procedures  . XR Hand Complete Left   No orders of the defined types were placed in this encounter.     Procedures: No procedures performed   Clinical Data: No additional findings.   Subjective: Chief Complaint  Patient presents with  . Left Hand - Pain, Follow-up    HPI  42 year old female here for follow-up for sprain of her fifth MCP.  She has continued discomfort states she feels like there is something between her fourth and fifth metacarpal.  She has been having therapy and per their note has been progressing well.  Patient feels like she is doing well with therapy as well.  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  She was taking Mobic that she had not found beneficial.  However, she takes Tylenol which she feels helps.  She denies any erythema, bruising, numbness or tingling.  Review of Systems See HPI  Objective: Vital Signs: There were no vitals taken for this visit.  Physical Exam  GEN: awake, alert, NAD  Left Hand: Inspection: No obvious deformity. No swelling, erythema or bruising Palpation: Tenderness over fifth MCP and along the fifth metacarpal. No ligamentous laxity on exam ROM: Full ROM of the digits and wrist Strength: 5/5 strength in  the forearm, wrist and interosseus muscles Neurovascular: NV intact    Specialty Comments:  No specialty comments available.  Imaging: No results found.   PMFS History: Patient Active Problem List   Diagnosis Date Noted  . Sprain of metacarpophalangeal joint of right little finger 04/03/2018   Past Medical History:  Diagnosis Date  . Asthma   . Hypertension     Family History  Problem Relation Age of Onset  . Heart failure Mother   . Diabetes Mother   . Hypertension Mother   . Diabetes Father   . Hypertension Father   . Stroke Father     Past Surgical History:  Procedure Laterality Date  . CHOLECYSTECTOMY  2012  . KNEE SURGERY Left    Social History   Occupational History  . Not on file  Tobacco Use  . Smoking status: Current Every Day Smoker    Packs/day: 0.25    Years: 15.00    Pack years: 3.75    Types: Cigarettes  . Smokeless tobacco: Never Used  Substance and Sexual Activity  . Alcohol use: Yes    Comment: socially  . Drug use: No  . Sexual activity: Never

## 2018-05-14 ENCOUNTER — Telehealth (INDEPENDENT_AMBULATORY_CARE_PROVIDER_SITE_OTHER): Payer: Self-pay

## 2018-05-14 NOTE — Telephone Encounter (Signed)
Faxed 05/08/18 office and work note to case mgr per her request

## 2018-05-17 ENCOUNTER — Telehealth (INDEPENDENT_AMBULATORY_CARE_PROVIDER_SITE_OTHER): Payer: Self-pay | Admitting: Orthopaedic Surgery

## 2018-05-17 NOTE — Telephone Encounter (Signed)
Returned call to Sheila FanningJulie mullins(nurse case Production designer, theatre/television/filmmanager) with [facility] left message to return call. Advised patient do not have a scheduled f/u appointment  [phone]

## 2018-06-08 ENCOUNTER — Ambulatory Visit (INDEPENDENT_AMBULATORY_CARE_PROVIDER_SITE_OTHER): Payer: No Typology Code available for payment source | Admitting: Orthopaedic Surgery

## 2018-06-12 ENCOUNTER — Telehealth (INDEPENDENT_AMBULATORY_CARE_PROVIDER_SITE_OTHER): Payer: Self-pay | Admitting: Orthopaedic Surgery

## 2018-06-12 NOTE — Telephone Encounter (Signed)
Returned call to patient left message to return call to reschedule appointment  [phone]

## 2018-06-18 ENCOUNTER — Ambulatory Visit (HOSPITAL_COMMUNITY)
Admission: EM | Admit: 2018-06-18 | Discharge: 2018-06-18 | Disposition: A | Discharge status: Home / Self Care | Payer: Medicaid Other | Attending: Urgent Care | Admitting: Urgent Care

## 2018-06-18 ENCOUNTER — Encounter (HOSPITAL_COMMUNITY): Payer: Self-pay | Admitting: Emergency Medicine

## 2018-06-18 DIAGNOSIS — H53143 Visual discomfort, bilateral: Secondary | ICD-10-CM | POA: Diagnosis not present

## 2018-06-18 DIAGNOSIS — J019 Acute sinusitis, unspecified: Secondary | ICD-10-CM

## 2018-06-18 DIAGNOSIS — R030 Elevated blood-pressure reading, without diagnosis of hypertension: Secondary | ICD-10-CM

## 2018-06-18 DIAGNOSIS — I10 Essential (primary) hypertension: Secondary | ICD-10-CM | POA: Diagnosis not present

## 2018-06-18 DIAGNOSIS — R519 Headache, unspecified: Secondary | ICD-10-CM

## 2018-06-18 DIAGNOSIS — H53149 Visual discomfort, unspecified: Secondary | ICD-10-CM

## 2018-06-18 DIAGNOSIS — Z8669 Personal history of other diseases of the nervous system and sense organs: Secondary | ICD-10-CM

## 2018-06-18 DIAGNOSIS — T7840XA Allergy, unspecified, initial encounter: Secondary | ICD-10-CM | POA: Diagnosis not present

## 2018-06-18 DIAGNOSIS — R51 Headache: Secondary | ICD-10-CM | POA: Diagnosis not present

## 2018-06-18 DIAGNOSIS — Z9109 Other allergy status, other than to drugs and biological substances: Secondary | ICD-10-CM

## 2018-06-18 LAB — POCT I-STAT, CHEM 8
BUN: 7 mg/dL (ref 6–20)
CALCIUM ION: 1.22 mmol/L (ref 1.15–1.40)
CHLORIDE: 105 mmol/L (ref 98–111)
Creatinine, Ser: 0.7 mg/dL (ref 0.44–1.00)
Glucose, Bld: 79 mg/dL (ref 70–99)
HCT: 40 % (ref 36.0–46.0)
Hemoglobin: 13.6 g/dL (ref 12.0–15.0)
POTASSIUM: 3.7 mmol/L (ref 3.5–5.1)
SODIUM: 141 mmol/L (ref 135–145)
TCO2: 23 mmol/L (ref 22–32)

## 2018-06-18 MED ORDER — FLUCONAZOLE 150 MG PO TABS: 150.0000 mg | ORAL_TABLET | ORAL | 0 refills | Status: DC

## 2018-06-18 MED ORDER — AMLODIPINE BESYLATE 5 MG PO TABS: 5.0000 mg | ORAL_TABLET | Freq: Every day | ORAL | 0 refills | Status: AC

## 2018-06-18 MED ORDER — AMOXICILLIN 500 MG PO CAPS: 500.0000 mg | ORAL_CAPSULE | Freq: Three times a day (TID) | ORAL | 0 refills | Status: DC

## 2018-06-18 NOTE — ED Provider Notes (Signed)
MRN: [id number] DOB: May 28, 1976  Subjective:   Sheila Garner is a 42 y.o. female presenting for 1 week history of worsening sinus headache, frontal sinus pain, photophobia.  Patient reports that she has allergies but is not taking anything for them, has stuffy nose.  Has been using pseudoephedrine consistently with only temporary relief.  Reports that she had an episode of right-sided upper chest pain and belly pain yesterday.  Has felt nauseous today.  Has also felt feverish, malaise.  Denies ear pain, throat pain, active chest pain, shortness of breath, heart racing, palpitations, active belly pain, diaphoresis, confusion, dizziness.  She smokes 1/2 pack/day.  She just moved here from [place] a couple months ago and does not have a PCP.  Manages her hypertension with lisinopril 10 mg once daily.  She has a history of migraines and has used Maxalt in the past for this.  No current facility-administered medications for this encounter.   Current Outpatient Medications:  .  lisinopril (PRINIVIL,ZESTRIL) 10 MG tablet, Take 1 tablet (10 mg total) by mouth daily., Disp: 90 tablet, Rfl: 1 .  rizatriptan (MAXALT) 10 MG tablet, Take 1 tablet (10 mg total) by mouth once as needed for migraine. May repeat in 2 hours if needed, Disp: 30 tablet, Rfl: 0 .  topiramate (TOPAMAX) 25 MG tablet, Take 1 tablet (25 mg total) by mouth at bedtime., Disp: 30 tablet, Rfl: 0   Allergies  Allergen Reactions  . Latex Rash    Past Medical History:  Diagnosis Date  . Asthma   . Hypertension      Past Surgical History:  Procedure Laterality Date  . CHOLECYSTECTOMY  2012  . KNEE SURGERY Left     Family History  Problem Relation Age of Onset  . Heart failure Mother   . Diabetes Mother   . Hypertension Mother   . Diabetes Father   . Hypertension Father   . Stroke Father      Objective:   Vitals: BP (!) 179/109   Pulse 79   Temp 97.8 F (36.6 C)   Resp 18   SpO2 100%   BP Readings from Last 3  Encounters:  06/18/18 (!) 179/109  04/10/18 (!) 150/99  09/06/17 (!) 153/101    Physical Exam  Constitutional: She is oriented to person, place, and time. She appears well-developed and well-nourished.  HENT:  Right Ear: Tympanic membrane normal.  Left Ear: Tympanic membrane normal.  Nose: Mucosal edema and sinus tenderness present. No rhinorrhea.  Mouth/Throat: Oropharynx is clear and moist.  Neck: Normal range of motion. Neck supple.  Cardiovascular: Normal rate, regular rhythm, normal heart sounds and intact distal pulses. Exam reveals no gallop and no friction rub.  No murmur heard. Pulmonary/Chest: Effort normal and breath sounds normal. No stridor. No respiratory distress. She has no wheezes. She has no rales.  Abdominal: Soft. Bowel sounds are normal. She exhibits no distension and no mass. There is no tenderness. There is no rebound and no guarding.  Musculoskeletal: Normal range of motion. She exhibits no edema.  Lymphadenopathy:    She has no cervical adenopathy.  Neurological: She is alert and oriented to person, place, and time. She displays normal reflexes. No cranial nerve deficit. Coordination normal.  Skin: Skin is warm and dry. No rash noted. No erythema. No pallor.  Psychiatric: She has a normal mood and affect.   ECG interpretation - Sinus rhythm at 77 bpm.  Results for orders placed or performed during the hospital encounter of 06/18/18 (from  the past 24 hour(s))  I-STAT, chem 8     Status: None   Collection Time: 06/18/18 12:59 PM  Result Value Ref Range   Sodium 141 135 - 145 mmol/L   Potassium 3.7 3.5 - 5.1 mmol/L   Chloride 105 98 - 111 mmol/L   BUN 7 6 - 20 mg/dL   Creatinine, Ser 1.61 0.44 - 1.00 mg/dL   Glucose, Bld 79 70 - 99 mg/dL   Calcium, Ion 0.96 0.45 - 1.40 mmol/L   TCO2 23 22 - 32 mmol/L   Hemoglobin 13.6 12.0 - 15.0 g/dL   HCT 40.9 81.1 - 91.4 %    Assessment and Plan :   Acute sinusitis, recurrence not specified, unspecified  location  Sinus headache  Photophobia  History of migraine  Environmental allergies  Essential hypertension  Elevated blood pressure reading  We will manage her for sinusitis given her uncontrolled allergies and sinus pain on physical exam.  She is to use Diflucan for antibiotic associated yeast infection.  Counseled patient on management of her high blood pressure, will use amlodipine in addition to lisinopril.  It is likely that the use of pseudoephedrine has taken a much higher than she is used to.  ER precautions reviewed with patient extensively.  Return to clinic precautions also reviewed.   Wallis Bamberg, PA-C 06/18/18 1332

## 2018-06-18 NOTE — ED Triage Notes (Signed)
Pt states for the last week shes been taking sudafed and thinks this headache is from her sinuses. C/o headache for the last day, facial pain.

## 2018-06-20 ENCOUNTER — Emergency Department (HOSPITAL_COMMUNITY)
Admission: EM | Admit: 2018-06-20 | Discharge: 2018-06-20 | Disposition: A | Discharge status: Home / Self Care | Payer: Medicaid Other | Attending: Emergency Medicine | Admitting: Emergency Medicine

## 2018-06-20 ENCOUNTER — Encounter (HOSPITAL_COMMUNITY): Payer: Self-pay | Admitting: Emergency Medicine

## 2018-06-20 DIAGNOSIS — Z5321 Procedure and treatment not carried out due to patient leaving prior to being seen by health care provider: Secondary | ICD-10-CM | POA: Diagnosis not present

## 2018-06-20 DIAGNOSIS — R519 Headache, unspecified: Secondary | ICD-10-CM

## 2018-06-20 DIAGNOSIS — R51 Headache: Secondary | ICD-10-CM | POA: Insufficient documentation

## 2018-06-20 NOTE — ED Triage Notes (Signed)
Pt presents to eD for assessment of headache, left eye blurred vision, congestion, intermittent nausea, chest pain and "racing" feeling.  No focal neuro deficits noted in triage.  Symptoms this morning when pt woke up.  Hx of hypertension, sent from [facility], started on new BP med and antibiotics.

## 2018-06-20 NOTE — ED Provider Notes (Cosign Needed)
Patient placed in Quick Look pathway, seen and evaluated   Chief Complaint: headache and blurry vision  HPI:   Sheila Garner is a 42 y.o. female who presents to the ED with headache and blurry vision. Patient went to Urgent care for 1 week history of worsening sinus headache, frontal sinus pain, photophobia.  Patient reports that she has allergies but is not taking anything for them, has stuffy nose.  Has been using pseudoephedrine consistently with only temporary relief.  Reports that she had an episode of right-sided upper chest pain and belly pain yesterday. Nausea earlier today but none now.  Denies ear pain, throat pain, shortness of breath, abdominal pain, diaphoresis. Patient does report dizziness.  She smokes 1/2 pack/day.  She just moved here from [place] a couple months ago and does not have a PCP.  Manages her hypertension with lisinopril 10 mg once daily.  She has a history of migraines and has used Maxalt in the past for this. Patient states   ROS: Neuro: headache and dizziness  Head: facial pain in sinus areas Physical Exam: BP (!) 145/108 (BP Location: Right Arm)   Pulse 92   Temp 98.2 F (36.8 C) (Oral)   Resp 18   SpO2 100%    Gen: No distress  Neuro: Awake and Alert, grips are equal, no cranial nerve defect. No pronator drift.   Skin: Warm and dry    Initiation of care has begun. The patient has been counseled on the process, plan, and necessity for staying for the completion/evaluation, and the remainder of the medical screening examination    Janne Napoleon, NP 06/20/18 1439

## 2018-06-20 NOTE — ED Notes (Signed)
Pt came up to desk handed stickers and blood pressure cup and said she was leaving. Would not let me say anything and left.

## 2018-06-22 ENCOUNTER — Encounter (INDEPENDENT_AMBULATORY_CARE_PROVIDER_SITE_OTHER): Payer: Self-pay | Admitting: Orthopaedic Surgery

## 2018-06-22 ENCOUNTER — Ambulatory Visit (INDEPENDENT_AMBULATORY_CARE_PROVIDER_SITE_OTHER): Payer: No Typology Code available for payment source | Admitting: Orthopaedic Surgery

## 2018-06-22 VITALS — Ht 64.0 in | Wt 170.0 lb

## 2018-06-22 DIAGNOSIS — S63657D Sprain of metacarpophalangeal joint of left little finger, subsequent encounter: Secondary | ICD-10-CM | POA: Diagnosis not present

## 2018-06-22 NOTE — Progress Notes (Signed)
   Office Visit Note   Patient: Sheila Garner           Date of Birth: 02/14/1976           MRN: [id number] Visit Date: 06/22/2018              Requested by: No referring provider defined for this encounter. PCP: Patient, No Pcp Per   Assessment & Plan: Visit Diagnoses:  1. Sprain of metacarpophalangeal (MCP) joint of left little finger, subsequent encounter     Plan: At this point patient has recovered very well.  She has completed hand therapy.  She is able to do full duty.  Work note was provided today.  Follow-up as needed.  Follow-Up Instructions: Return if symptoms worsen or fail to improve.   Orders:  No orders of the defined types were placed in this encounter.  No orders of the defined types were placed in this encounter.     Procedures: No procedures performed   Clinical Data: No additional findings.   Subjective: Chief Complaint  Patient presents with  . Left Hand - Follow-up    Sheila Garner follows up today for her left little finger sprain.  She is doing well.  She has been doing full duty essentially without any problems.  She does report some soreness on the dorsum of her MCP joint but overall she does not have any real complaints.   Review of Systems   Objective: Vital Signs: Ht 5\' 4"  (1.626 m)   Wt 170 lb (77.1 kg)   BMI 29.18 kg/m   Physical Exam  Ortho Exam Left hand exam shows no tenderness palpation.  She is able to make a full composite fist.  Normal range of motion of the MCP joint. Specialty Comments:  No specialty comments available.  Imaging: No results found.   PMFS History: There are no active problems to display for this patient.  Past Medical History:  Diagnosis Date  . Asthma   . Hypertension     Family History  Problem Relation Age of Onset  . Heart failure Mother   . Diabetes Mother   . Hypertension Mother   . Diabetes Father   . Hypertension Father   . Stroke Father     Past Surgical History:  Procedure  Laterality Date  . CHOLECYSTECTOMY  2012  . KNEE SURGERY Left    Social History   Occupational History  . Not on file  Tobacco Use  . Smoking status: Current Every Day Smoker    Packs/day: 0.25    Years: 15.00    Pack years: 3.75    Types: Cigarettes  . Smokeless tobacco: Never Used  Substance and Sexual Activity  . Alcohol use: Yes    Comment: socially  . Drug use: No  . Sexual activity: Never

## 2018-06-23 ENCOUNTER — Emergency Department (HOSPITAL_COMMUNITY)
Admission: EM | Admit: 2018-06-23 | Discharge: 2018-06-24 | Disposition: A | Discharge status: Home / Self Care | Payer: Medicaid Other | Attending: Emergency Medicine | Admitting: Emergency Medicine

## 2018-06-23 ENCOUNTER — Encounter (HOSPITAL_COMMUNITY): Payer: Self-pay

## 2018-06-23 ENCOUNTER — Emergency Department (HOSPITAL_COMMUNITY): Payer: Medicaid Other

## 2018-06-23 ENCOUNTER — Other Ambulatory Visit: Payer: Self-pay

## 2018-06-23 DIAGNOSIS — J45909 Unspecified asthma, uncomplicated: Secondary | ICD-10-CM | POA: Insufficient documentation

## 2018-06-23 DIAGNOSIS — R0981 Nasal congestion: Secondary | ICD-10-CM | POA: Insufficient documentation

## 2018-06-23 DIAGNOSIS — Z91040 Latex allergy status: Secondary | ICD-10-CM | POA: Insufficient documentation

## 2018-06-23 DIAGNOSIS — Z79899 Other long term (current) drug therapy: Secondary | ICD-10-CM | POA: Diagnosis not present

## 2018-06-23 DIAGNOSIS — I10 Essential (primary) hypertension: Secondary | ICD-10-CM | POA: Diagnosis not present

## 2018-06-23 DIAGNOSIS — R0602 Shortness of breath: Secondary | ICD-10-CM | POA: Insufficient documentation

## 2018-06-23 DIAGNOSIS — R0789 Other chest pain: Secondary | ICD-10-CM | POA: Insufficient documentation

## 2018-06-23 DIAGNOSIS — R079 Chest pain, unspecified: Secondary | ICD-10-CM

## 2018-06-23 DIAGNOSIS — F17210 Nicotine dependence, cigarettes, uncomplicated: Secondary | ICD-10-CM | POA: Diagnosis not present

## 2018-06-23 LAB — CBC WITH DIFFERENTIAL/PLATELET
BASOS ABS: 0 10*3/uL (ref 0.0–0.1)
Basophils Relative: 0 %
Eosinophils Absolute: 0.2 10*3/uL (ref 0.0–0.7)
Eosinophils Relative: 3 %
HEMATOCRIT: 37.5 % (ref 36.0–46.0)
HEMOGLOBIN: 12.1 g/dL (ref 12.0–15.0)
LYMPHS ABS: 3.9 10*3/uL (ref 0.7–4.0)
LYMPHS PCT: 53 %
MCH: 24.9 pg — AB (ref 26.0–34.0)
MCHC: 32.3 g/dL (ref 30.0–36.0)
MCV: 77.2 fL — AB (ref 78.0–100.0)
Monocytes Absolute: 0.6 10*3/uL (ref 0.1–1.0)
Monocytes Relative: 8 %
NEUTROS ABS: 2.6 10*3/uL (ref 1.7–7.7)
Neutrophils Relative %: 36 %
Platelets: 417 10*3/uL — ABNORMAL HIGH (ref 150–400)
RBC: 4.86 MIL/uL (ref 3.87–5.11)
RDW: 16.5 % — ABNORMAL HIGH (ref 11.5–15.5)
WBC: 7.3 10*3/uL (ref 4.0–10.5)

## 2018-06-23 LAB — BASIC METABOLIC PANEL
ANION GAP: 7 (ref 5–15)
BUN: 13 mg/dL (ref 6–20)
CHLORIDE: 108 mmol/L (ref 98–111)
CO2: 29 mmol/L (ref 22–32)
Calcium: 9.6 mg/dL (ref 8.9–10.3)
Creatinine, Ser: 0.62 mg/dL (ref 0.44–1.00)
GFR calc Af Amer: >60 mL/min (ref >60)
Glucose, Bld: 94 mg/dL (ref 70–99)
POTASSIUM: 4.3 mmol/L (ref 3.5–5.1)
Sodium: 144 mmol/L (ref 135–145)

## 2018-06-23 LAB — D-DIMER, QUANTITATIVE (NOT AT ARMC): D DIMER QUANT: 0.31 ug{FEU}/mL (ref 0.00–0.50)

## 2018-06-23 LAB — I-STAT BETA HCG BLOOD, ED (MC, WL, AP ONLY)

## 2018-06-23 LAB — I-STAT TROPONIN, ED: Troponin i, poc: 0 ng/mL (ref 0.00–0.08)

## 2018-06-23 NOTE — ED Triage Notes (Signed)
Pt presents to ED from home for SOB and hypertension. Pt reports that she has a head cold last week, and has been taking cold meds at home. Pt reports that she has been having occasional shortness of breath, but she was having episodes of high blood pressure. Pt was put on new BP medication this week. Pt denies chest pain.

## 2018-06-23 NOTE — ED Provider Notes (Signed)
[facility]-EMERGENCY DEPT Provider Note   CSN: [id number] Arrival date & time: 06/23/18  1940     History   Chief Complaint Chief Complaint  Patient presents with  . Shortness of Breath  . Hypertension    HPI Sheila Garner is a 42 y.o. female past medical history of asthma, hypertension who presents for evaluation of shortness of breath, chest pain, hypertension.  Patient reports that she was at home watching TV at about 9 or 10 PM this evening when she started having an episode of sharp chest pain in the midsternal area.  She states that the pain did not radiate.  She does report that the pain is worse with deep inspiration but not worse with exertion.  She did not get.nauseous or diaphoretic with the pain.  She reports associated shortness of breath.  She states that the symptoms concerned her so she took her blood pressure and states it was elevated with systolic blood pressure in 160s.  She reports that she has a history of high blood pressure and is currently on lisinopril 10 mg.  Was seen at urgent care a few days ago for evaluation of some sinus congestion and blood pressure was elevated at that time also.  She had amlodipine added to her course, which she has been taking.  Patient states she became concerned with her blood pressure was high prompting ED visit.  By ED arrival, she states that she is all of her chest pain, difficulty breathing is completely gone.  Patient reports that the nasal congestion, rhinorrhea is still lingering but has improved significantly.  She was put on amoxicillin for upper respiratory infection.  Patient states she has a history of hypertension but no diabetes.  She denies any personal cardiac history.  Denies any family heart attacks before the age of 95.  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  She also reports that she has several trips down to [place].  She has had some  ankle swelling but denies any unilateral swelling, warmth, erythema of her extremities.  Patient denies any recent surgeries, hospitalizations.  She has a history of asthma but states that this did not feel like an asthma exacerbation. She has had some blurry vision over the last few days but does report it has gotten better.  Patient denies any headache, numbness/weakness of arms or legs, nausea/vomiting, abdominal pain.  The history is provided by the patient.    Past Medical History:  Diagnosis Date  . Asthma   . Hypertension     There are no active problems to display for this patient.   Past Surgical History:  Procedure Laterality Date  . CHOLECYSTECTOMY  2012  . KNEE SURGERY Left      OB History   None      Home Medications    Prior to Admission medications   Medication Sig Start Date End Date Taking? Authorizing Provider  amLODipine (NORVASC) 5 MG tablet Take 1 tablet (5 mg total) by mouth daily. 06/18/18   Wallis Bamberg, PA-C  amoxicillin (AMOXIL) 500 MG capsule Take 1 capsule (500 mg total) by mouth 3 (three) times daily. 06/18/18   Wallis Bamberg, PA-C  fluconazole (DIFLUCAN) 150 MG tablet Take 1 tablet (150 mg total) by mouth once a week. 06/18/18   Wallis Bamberg, PA-C  lisinopril (PRINIVIL,ZESTRIL) 10 MG tablet Take 1 tablet (10 mg total) by mouth daily. 09/06/17  Elvina Sidle, MD  rizatriptan (MAXALT) 10 MG tablet Take 1 tablet (10 mg total) by mouth once as needed for migraine. May repeat in 2 hours if needed 04/10/18 05/10/18  Darci Current, MD  topiramate (TOPAMAX) 25 MG tablet Take 1 tablet (25 mg total) by mouth at bedtime. 04/10/18 05/10/18  Darci Current, MD    Family History Family History  Problem Relation Age of Onset  . Heart failure Mother   . Diabetes Mother   . Hypertension Mother   . Diabetes Father   . Hypertension Father   . Stroke Father     Social History Social History   Tobacco Use  . Smoking status: Current Every Day Smoker     Packs/day: 0.25    Years: 15.00    Pack years: 3.75    Types: Cigarettes  . Smokeless tobacco: Never Used  Substance Use Topics  . Alcohol use: Yes    Comment: socially  . Drug use: No     Allergies   Latex   Review of Systems Review of Systems  Constitutional: Negative for fever.  Respiratory: Positive for shortness of breath (Resolved). Negative for cough.   Cardiovascular: Positive for chest pain (Resolved) and leg swelling (ankle).  Gastrointestinal: Negative for abdominal pain, nausea and vomiting.  Genitourinary: Negative for dysuria and hematuria.  Neurological: Negative for headaches.  All other systems reviewed and are negative.    Physical Exam Updated Vital Signs BP (!) 140/104 (BP Location: Left Arm)   Pulse 88   Temp 98.7 F (37.1 C) (Oral)   Resp 18   Ht 5\' 4"  (1.626 m)   Wt 77.1 kg   LMP 05/21/2018   SpO2 96%   BMI 29.18 kg/m   Physical Exam  Constitutional: She is oriented to person, place, and time. She appears well-developed and well-nourished.  Sitting comfortably on examination table  HENT:  Head: Normocephalic and atraumatic.  Mouth/Throat: Oropharynx is clear and moist and mucous membranes are normal.  Eyes: Pupils are equal, round, and reactive to light. Conjunctivae, EOM and lids are normal.  Neck: Full passive range of motion without pain.  Cardiovascular: Normal rate, regular rhythm, normal heart sounds and normal pulses. Exam reveals no gallop and no friction rub.  No murmur heard. Pulses:      Radial pulses are 2+ on the right side, and 2+ on the left side.       Dorsalis pedis pulses are 2+ on the right side, and 2+ on the left side.  Pulmonary/Chest: Effort normal and breath sounds normal. She has no wheezes.  Lungs clear to auscultation bilaterally.  Symmetric chest rise.  No wheezing, rales, rhonchi.  Abdominal: Soft. Normal appearance. There is no tenderness. There is no rigidity and no guarding.  Abdomen is soft,  non-distended, non-tender. No rigidity, No guarding. No peritoneal signs.  Musculoskeletal: Normal range of motion.  Bilateral lower extremities are symmetric in appearance without any overlying warmth, erythema.  Neurological: She is alert and oriented to person, place, and time.  Cranial nerves III-XII intact Follows commands, Moves all extremities  5/5 strength to BUE and BLE  Sensation intact throughout all major nerve distributions Normal finger to nose. No gait abnormalities  No slurred speech. No facial droop.  Skin: Skin is warm and dry. Capillary refill takes less than 2 seconds.  Psychiatric: She has a normal mood and affect. Her speech is normal.  Nursing note and vitals reviewed.    ED Treatments / Results  Labs (all labs ordered are listed, but only abnormal results are displayed) Labs Reviewed  CBC WITH DIFFERENTIAL/PLATELET - Abnormal; Notable for the following components:      Result Value   MCV 77.2 (*)    MCH 24.9 (*)    RDW 16.5 (*)    Platelets 417 (*)    All other components within normal limits  BASIC METABOLIC PANEL  D-DIMER, QUANTITATIVE (NOT AT [facility])  I-STAT TROPONIN, ED  I-STAT BETA HCG BLOOD, ED ([facility], [facility], [facility] ONLY)    EKG EKG Interpretation  Date/Time:  Saturday June 23 2018 20:01:25 EDT Ventricular Rate:  91 PR Interval:    QRS Duration: 91 QT Interval:  364 QTC Calculation: 448 R Axis:   114 Text Interpretation:  Right and left arm electrode reversal, interpretation assumes no reversal Sinus rhythm Probable left atrial enlargement Probable lateral infarct, age indeterminate Baseline wander in lead(s) V6 Confirmed by Loren Racer ([id number]) on 06/24/2018 12:00:01 AM   Radiology Dg Chest 2 View  Result Date: 06/23/2018 CLINICAL DATA:  Chest pain and shortness of breath. EXAM: CHEST - 2 VIEW COMPARISON:  None. FINDINGS: The cardiomediastinal contours are normal. The lungs are clear. Pulmonary vasculature is normal. No consolidation,  pleural effusion, or pneumothorax. No acute osseous abnormalities are seen. IMPRESSION: Negative radiographs of the chest. Electronically Signed   By: Narda Rutherford M.D.   On: 06/23/2018 22:55    Procedures Procedures (including critical care time)  Medications Ordered in ED Medications - No data to display   Initial Impression / Assessment and Plan / ED Course  I have reviewed the triage vital signs and the nursing notes.  Pertinent labs & imaging results that were available during my care of the patient were reviewed by me and considered in my medical decision making (see chart for details).     41 y.o. female with possible history of asthma, hypertension who presents for evaluation of an episode of shortness of breath, chest pain that occurred while she was at home.  Checked her blood pressure and found it to be elevated with systolic 160.  Reports a history of hypertension is currently on lisinopril 10 mg.  Recently seen in urgent care for evaluation of sinus symptoms and was added amlodipine to her medications.  Additionally, she is on antibiotics for upper respiratory infection.  On ED arrival, symptoms have completely resolved.  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 Patient is afebrile, non-toxic appearing, sitting comfortably on examination table. Vital signs reviewed and stable.  She is slightly hypertensive with systolic blood pressure in the 140s.  No neuro deficits noted on exam.  Consider ACS etiology, though this does not sound like a typical presentation of ACS.  Also consider infectious etiology versus muscle skeletal pain.  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  Plan check basic labs, EKG, chest x-ray, d-dimer.  CBC shows no significant leukocytosis or anemia.  I-STAT beta negative.  I-STAT troponin negative.  D-dimer negative.  BMP is unremarkable.  Chest x-ray negative for any acute infectious etiology.    At this time, do not suspect ACS etiology as the source of patients pain. Her presentation was not typical for an ACS etiology. Based on patient's risk factors and presentation, she has a HEART score of 2.   Discussed results with patient.  Patient is currently not having any pain at this time.  She has not had any symptoms since  being here in the ED.  I discussed with her regarding her risk factors and presentation.  I discussed that I was reassured by her work-up here in the ED and the fact that she was no longer having pain.  I discussed that she does have some risk factors and the fact that her pain started less than 6 hours prior to arrival, she would qualify to get a delta trop to rule out ACS. Patient does not wish to stay any longer and have any further workup. I feel that this is reasonable as I do not suspect ACS etiology.  Patient has a an appointment with her primary care doctor in 2 days.  Encouraged her to keep a blood pressure log and take it with her to her primary care doctor for evaluation of her blood pressure medication regimen. Patient had ample opportunity for questions and discussion. All patient's questions were answered with full understanding. Strict return precautions discussed. Patient expresses understanding and agreement to plan.   Final Clinical Impressions(s) / ED Diagnoses   Final diagnoses:  Chest pain, unspecified type  Hypertension, unspecified type    ED Discharge Orders    None       Rosana HoesLayden, Anani Gu A, PA-C 06/24/18 0018    Loren RacerYelverton, David, MD 06/24/18 1630

## 2018-06-24 NOTE — Discharge Instructions (Signed)
Take your blood pressure medications as directed.  As we discussed, check your blood pressure in the morning and at night and record your blood pressure readings.  Take these readings to your primary care doctor when you see him as scheduled next week.  Return to the Emergency Department immediately if you experiencing worsening chest pain, difficulty breathing, nausea/vomiting, get very sweaty, headache or any other worsening or concerning symptoms.

## 2018-06-26 ENCOUNTER — Telehealth (INDEPENDENT_AMBULATORY_CARE_PROVIDER_SITE_OTHER): Payer: Self-pay

## 2018-06-26 NOTE — Telephone Encounter (Signed)
Faxed the 06/22/18 office note and work note to case mgr per her request

## 2018-08-01 ENCOUNTER — Telehealth (INDEPENDENT_AMBULATORY_CARE_PROVIDER_SITE_OTHER): Payer: Self-pay

## 2018-08-01 NOTE — Telephone Encounter (Signed)
Fax the 08/02/18 office note to [facility] case mgr when ready

## 2018-08-02 ENCOUNTER — Ambulatory Visit (INDEPENDENT_AMBULATORY_CARE_PROVIDER_SITE_OTHER): Payer: Worker's Compensation | Admitting: Orthopaedic Surgery

## 2018-08-02 ENCOUNTER — Encounter (INDEPENDENT_AMBULATORY_CARE_PROVIDER_SITE_OTHER): Payer: Self-pay | Admitting: Orthopaedic Surgery

## 2018-08-02 ENCOUNTER — Encounter (INDEPENDENT_AMBULATORY_CARE_PROVIDER_SITE_OTHER): Payer: Self-pay

## 2018-08-02 ENCOUNTER — Ambulatory Visit (INDEPENDENT_AMBULATORY_CARE_PROVIDER_SITE_OTHER): Payer: Worker's Compensation

## 2018-08-02 DIAGNOSIS — S62639A Displaced fracture of distal phalanx of unspecified finger, initial encounter for closed fracture: Secondary | ICD-10-CM | POA: Diagnosis not present

## 2018-08-02 DIAGNOSIS — S62633A Displaced fracture of distal phalanx of left middle finger, initial encounter for closed fracture: Secondary | ICD-10-CM | POA: Diagnosis not present

## 2018-08-02 NOTE — Progress Notes (Signed)
Office Visit Note   Patient: Sheila Garner           Date of Birth: 09/24/1976           MRN: [id number] Visit Date: 08/02/2018              Requested by: No referring provider defined for this encounter. PCP: Patient, No Pcp Per   Assessment & Plan: Visit Diagnoses:  1. Closed fracture of tuft of distal phalanx of finger     Plan: Impression is left long finger tuft fracture.  The patient is now 4 weeks out we will start her in hand therapy.  A prescription was provided today.  I will also write her a work note for no use of the left hand for the next 4 weeks.  Follow-up with [place] in 4 weeks time for recheck. Follow-Up Instructions: Return in about 4 weeks (around 08/30/2018).   Orders:  Orders Placed This Encounter  Procedures  . XR Finger Middle Left   No orders of the defined types were placed in this encounter.     Procedures: No procedures performed   Clinical Data: No additional findings.   Subjective: Chief Complaint  Patient presents with  . Left Middle Finger - Pain    HPI Patient is a pleasant 42 year old female who presents to our clinic today following an injury which occurred to her left long finger while at work on 07/01/2018.  She was closing her refrigerator door when another door slammed crushing the tip of the left long finger.  She was seen in urgent care setting where she was placed in a finger splint.  She notes no improvement of pain over the past several weeks.  She does note tingling and numbness to the distal finger.  This appears to be worse with cold weather.  She has been taking naproxen and Norco with minimal relief of symptoms.   Review of Systems as detailed in HPI.  All others reviewed and are negative.   Objective: Vital Signs: There were no vitals taken for this visit.  Physical Exam well-developed and well-nourished female no acute distress.  Alert and oriented x3.  Ortho Exam examination of her left long finger reveals moderate  swelling to the distal aspect.  Exquisite tenderness throughout.  She does have gel nails in place but it does appear that her nail may be turning black.  Decreased sensation to the fingertip.  Specialty Comments:  No specialty comments available.  Imaging: Xr Finger Middle Left  Result Date: 08/02/2018 X-rays demonstrate a tuft fracture to the left long finger    PMFS History: Patient Active Problem List   Diagnosis Date Noted  . Closed fracture of tuft of distal phalanx of finger 08/02/2018   Past Medical History:  Diagnosis Date  . Asthma   . Hypertension     Family History  Problem Relation Age of Onset  . Heart failure Mother   . Diabetes Mother   . Hypertension Mother   . Diabetes Father   . Hypertension Father   . Stroke Father     Past Surgical History:  Procedure Laterality Date  . CHOLECYSTECTOMY  2012  . KNEE SURGERY Left    Social History   Occupational History  . Not on file  Tobacco Use  . Smoking status: Current Every Day Smoker    Packs/day: 0.25    Years: 15.00    Pack years: 3.75    Types: Cigarettes  . Smokeless tobacco: Never  Used  Substance and Sexual Activity  . Alcohol use: Yes    Comment: socially  . Drug use: No  . Sexual activity: Never

## 2018-08-02 NOTE — Addendum Note (Signed)
Addended by: Albertina Parr on: 08/02/2018 01:24 PM   Modules accepted: Orders

## 2018-08-09 ENCOUNTER — Telehealth (INDEPENDENT_AMBULATORY_CARE_PROVIDER_SITE_OTHER): Payer: Self-pay | Admitting: Orthopaedic Surgery

## 2018-08-09 NOTE — Telephone Encounter (Signed)
08/02/18 ov note faxed to [facility] [phone]

## 2018-08-17 ENCOUNTER — Ambulatory Visit (INDEPENDENT_AMBULATORY_CARE_PROVIDER_SITE_OTHER): Payer: Worker's Compensation | Admitting: Orthopaedic Surgery

## 2018-08-17 ENCOUNTER — Ambulatory Visit (INDEPENDENT_AMBULATORY_CARE_PROVIDER_SITE_OTHER): Payer: Worker's Compensation

## 2018-08-17 ENCOUNTER — Encounter (INDEPENDENT_AMBULATORY_CARE_PROVIDER_SITE_OTHER): Payer: Self-pay | Admitting: Physician Assistant

## 2018-08-17 DIAGNOSIS — S62639A Displaced fracture of distal phalanx of unspecified finger, initial encounter for closed fracture: Secondary | ICD-10-CM

## 2018-08-17 NOTE — Progress Notes (Signed)
Office Visit Note   Patient: Sheila Garner           Date of Birth: 1976/08/23           MRN: [id number] Visit Date: 08/17/2018              Requested by: No referring provider defined for this encounter. PCP: Patient, No Pcp Per   Assessment & Plan: Visit Diagnoses:  1. Closed fracture of tuft of distal phalanx of finger     Plan: Impression is tuft fracture left long finger.  Today, we left a voicemail with Judeth Cornfield the patient's Workmen's Comp. adjuster.  We have told her that we really need to get hand therapy approved.  In the meantime, we have instructed the patient on exercises to do for this.  She has been to hand therapy before for another injury and she will start to do some of the same modalities on her own that they did for that injury.  She will follow-up with [place] in 4 weeks time for recheck.  Follow-Up Instructions: Return in about 4 weeks (around 09/14/2018).   Orders:  Orders Placed This Encounter  Procedures  . XR Finger Middle Left   No orders of the defined types were placed in this encounter.     Procedures: No procedures performed   Clinical Data: No additional findings.   Subjective: Chief Complaint  Patient presents with  . Left Hand - Follow-up    HPI patient is a pleasant 42 year old female who presents to our clinic today for follow-up of her left long finger tuft fracture.  Date of injury 07/01/2018.  This was a [employer]. injury.  We saw her on 08/02/2018 when she was approximately 4 weeks out from the injury.  We faxed a physical therapy description to Workmen's Comp. who has yet to approve this.  Therefore, the patient has not started therapy.  She notes continued pain to the left long finger with associated swelling.  Pain is worse at the distal aspect over the fracture site.  She does note decreased sensation as well.  Review of Systems as detailed in HPI.  All others reviewed and are negative.   Objective: Vital Signs: There were  no vitals taken for this visit.  Physical Exam well-developed well-nourished female no acute distress.  Alert and oriented x3.  Ortho Exam examination of her left long finger reveals moderate swelling throughout.  Marked tenderness distal phalanx.  She does have bruising to the nailbed.  Decreased sensation distal phalanx.  She is able to bend the MCP, PIP and DIP joints, but not to their full extreme.  Specialty Comments:  No specialty comments available.  Imaging: Xr Finger Middle Left  Result Date: 08/17/2018 X-rays demonstrate a healing tuft fracture with stable alignment    PMFS History: Patient Active Problem List   Diagnosis Date Noted  . Closed fracture of tuft of distal phalanx of finger 08/02/2018   Past Medical History:  Diagnosis Date  . Asthma   . Hypertension     Family History  Problem Relation Age of Onset  . Heart failure Mother   . Diabetes Mother   . Hypertension Mother   . Diabetes Father   . Hypertension Father   . Stroke Father     Past Surgical History:  Procedure Laterality Date  . CHOLECYSTECTOMY  2012  . KNEE SURGERY Left    Social History   Occupational History  . Not on file  Tobacco Use  .  Smoking status: Current Every Day Smoker    Packs/day: 0.25    Years: 15.00    Pack years: 3.75    Types: Cigarettes  . Smokeless tobacco: Never Used  Substance and Sexual Activity  . Alcohol use: Yes    Comment: socially  . Drug use: No  . Sexual activity: Never

## 2018-08-22 ENCOUNTER — Telehealth (INDEPENDENT_AMBULATORY_CARE_PROVIDER_SITE_OTHER): Payer: Self-pay

## 2018-08-22 NOTE — Telephone Encounter (Signed)
No use of left hand until fu appt.  Do you know if they have approved PT?

## 2018-08-22 NOTE — Telephone Encounter (Signed)
See message.

## 2018-08-22 NOTE — Telephone Encounter (Signed)
Please advise. What updated work note would you like for me to give her.

## 2018-08-22 NOTE — Telephone Encounter (Signed)
Case manager needs updated work note from 08/17/18 visit

## 2018-08-22 NOTE — Telephone Encounter (Signed)
Faxed the 08/17/18 office note to case mgr per her request

## 2018-08-23 ENCOUNTER — Encounter (INDEPENDENT_AMBULATORY_CARE_PROVIDER_SITE_OTHER): Payer: Self-pay

## 2018-08-23 NOTE — Telephone Encounter (Signed)
FAXED WORK NOTE TO [phone]

## 2018-08-23 NOTE — Telephone Encounter (Signed)
Work note made. ? ?

## 2018-09-14 ENCOUNTER — Ambulatory Visit (INDEPENDENT_AMBULATORY_CARE_PROVIDER_SITE_OTHER): Payer: Worker's Compensation | Admitting: Orthopaedic Surgery

## 2018-09-14 ENCOUNTER — Encounter (INDEPENDENT_AMBULATORY_CARE_PROVIDER_SITE_OTHER): Payer: Self-pay | Admitting: Orthopaedic Surgery

## 2018-09-14 ENCOUNTER — Ambulatory Visit (INDEPENDENT_AMBULATORY_CARE_PROVIDER_SITE_OTHER): Payer: Worker's Compensation

## 2018-09-14 DIAGNOSIS — S62639A Displaced fracture of distal phalanx of unspecified finger, initial encounter for closed fracture: Secondary | ICD-10-CM

## 2018-09-14 NOTE — Progress Notes (Signed)
Office Visit Note   Patient: Sheila Garner           Date of Birth: Dec 10, 1975           MRN: [id number] Visit Date: 09/14/2018              Requested by: No referring provider defined for this encounter. PCP: Patient, No Pcp Per   Assessment & Plan: Visit Diagnoses:  1. Closed fracture of tuft of distal phalanx of finger     Plan: Impression is left long finger tuft fracture.  The patient is healing nicely but is still very symptomatic.  We will have her continue with hand therapy.  We will also continue her work restrictions of no use of the left hand for another 4 weeks.  Follow-up with [place] in 4 weeks time.  Should her pain significantly improve and she would like to return to full duty, she will call and let us know.  Follow-Up Instructions: Return in about 4 weeks (around 10/12/2018).   Orders:  Orders Placed This Encounter  Procedures  . XR Finger Middle Left   No orders of the defined types were placed in this encounter.     Procedures: No procedures performed   Clinical Data: No additional findings.   Subjective: Chief Complaint  Patient presents with  . Left Hand - Follow-up  . MIDDLE FINGER    HPI patient is a pleasant 42 year old female presents to our clinic today for follow-up of a tuft fracture distal phalanx left long finger.  Date of injury 07/01/2018.  This was an injury which occurred at work and is being filed under [employer].  Workmen's Comp. just approved hand therapy approximately 2 weeks ago.  The patient has been compliant attending her visits for this over the past 2 weeks.  She notes continued pain which she thinks may be getting worse.  She has been taking Tylenol as needed.  She does note that her nail is starting to come off.  Review of Systems as detailed in HPI.  All others and are negative.   Objective: Vital Signs: There were no vitals taken for this visit.  Physical Exam well-developed well-nourished female no acute distress.   Alert and oriented x3.  Ortho Exam examination of the left long finger reveals moderate swelling throughout.  Increased pain to the PIP DIP joints.  She does have evidence of a new nail starting to appear.  No signs of infection peer  Specialty Comments:  No specialty comments available.  Imaging: Xr Finger Middle Left  Result Date: 09/14/2018 Tuft fracture distal phalanx with great callus formation    PMFS History: Patient Active Problem List   Diagnosis Date Noted  . Closed fracture of tuft of distal phalanx of finger 08/02/2018   Past Medical History:  Diagnosis Date  . Asthma   . Hypertension     Family History  Problem Relation Age of Onset  . Heart failure Mother   . Diabetes Mother   . Hypertension Mother   . Diabetes Father   . Hypertension Father   . Stroke Father     Past Surgical History:  Procedure Laterality Date  . CHOLECYSTECTOMY  2012  . KNEE SURGERY Left    Social History   Occupational History  . Not on file  Tobacco Use  . Smoking status: Current Every Day Smoker    Packs/day: 0.25    Years: 15.00    Pack years: 3.75    Types: Cigarettes  .  Smokeless tobacco: Never Used  Substance and Sexual Activity  . Alcohol use: Yes    Comment: socially  . Drug use: No  . Sexual activity: Never

## 2018-09-18 ENCOUNTER — Telehealth (INDEPENDENT_AMBULATORY_CARE_PROVIDER_SITE_OTHER): Payer: Self-pay

## 2018-09-18 NOTE — Telephone Encounter (Signed)
Faxed the 09/14/18 office note and work note to case mgr per her request

## 2018-10-16 ENCOUNTER — Ambulatory Visit (INDEPENDENT_AMBULATORY_CARE_PROVIDER_SITE_OTHER): Payer: Worker's Compensation | Admitting: Orthopaedic Surgery

## 2018-10-16 ENCOUNTER — Ambulatory Visit (INDEPENDENT_AMBULATORY_CARE_PROVIDER_SITE_OTHER): Payer: Worker's Compensation

## 2018-10-16 ENCOUNTER — Encounter (INDEPENDENT_AMBULATORY_CARE_PROVIDER_SITE_OTHER): Payer: Self-pay | Admitting: Orthopaedic Surgery

## 2018-10-16 ENCOUNTER — Telehealth (INDEPENDENT_AMBULATORY_CARE_PROVIDER_SITE_OTHER): Payer: Self-pay

## 2018-10-16 DIAGNOSIS — S62639A Displaced fracture of distal phalanx of unspecified finger, initial encounter for closed fracture: Secondary | ICD-10-CM

## 2018-10-16 DIAGNOSIS — M79645 Pain in left finger(s): Secondary | ICD-10-CM

## 2018-10-16 NOTE — Telephone Encounter (Signed)
Fax todays office note to the work comp Sports coach when it is available

## 2018-10-16 NOTE — Progress Notes (Signed)
Office Visit Note   Patient: Sheila Garner           Date of Birth: January 26, 1976           MRN: [id number] Visit Date: 10/16/2018              Requested by: No referring provider defined for this encounter. PCP: Patient, No Pcp Per   Assessment & Plan: Visit Diagnoses:  1. Closed fracture of tuft of distal phalanx of finger   2. Pain in left finger(s)     Plan: Impression is healed tuft fracture left long finger.  Patient still has some decreased sensation to the fingertip as well as diffuse soreness to mcp joint, but otherwise feels good.  She has regained full motion and strength in hand therapy and is ready to go back to work full duty. Work note was provided to the patient today for this.  She will follow up with [place] as needed.  Follow-Up Instructions: Return if symptoms worsen or fail to improve.   Orders:  Orders Placed This Encounter  Procedures  . XR Finger Middle Left   No orders of the defined types were placed in this encounter.     Procedures: No procedures performed   Clinical Data: No additional findings.   Subjective: Chief Complaint  Patient presents with  . Left Middle Finger - Follow-up    HPI patient is a pleasant 43 year old female who presents to our clinic today for follow-up of her left long finger tuft fracture, date of injury 07/01/2018.  This occurred while at work and is being filed under [employer].  Her pain has dramatically improved, but does note pain to the PIP joint.  She also notes decreased sensation to the tip of her long finger.  She has finished hand therapy where she has regained full motion and near full strength.  Overall much improved.  Review of Systems as detailed in HPI.  All others reviewed and are negative.   Objective: Vital Signs: There were no vitals taken for this visit.  Physical Exam well-developed and well-nourished female in no acute distress.  Alert and oriented x3.  Ortho Exam examination of the  left long finger reveals no swelling.  No tenderness to the distal phalanx.  She has slight tenderness to the PIP joint.  Full range of motion throughout.  Decreased sensation distal phalanx.  Specialty Comments:  No specialty comments available.  Imaging: Xr Finger Middle Left  Result Date: 10/17/2018 Healed tuft fracture.  No acute findings to MCP joint.    PMFS History: Patient Active Problem List   Diagnosis Date Noted  . Sprain of metacarpophalangeal joint of right little finger 10/17/2018  . Closed fracture of tuft of distal phalanx of finger 08/02/2018   Past Medical History:  Diagnosis Date  . Asthma   . Hypertension     Family History  Problem Relation Age of Onset  . Heart failure Mother   . Diabetes Mother   . Hypertension Mother   . Diabetes Father   . Hypertension Father   . Stroke Father     Past Surgical History:  Procedure Laterality Date  . CHOLECYSTECTOMY  2012  . KNEE SURGERY Left    Social History   Occupational History  . Not on file  Tobacco Use  . Smoking status: Current Every Day Smoker    Packs/day: 0.25    Years: 15.00    Pack years: 3.75    Types: Cigarettes  .  Smokeless tobacco: Never Used  Substance and Sexual Activity  . Alcohol use: Yes    Comment: socially  . Drug use: No  . Sexual activity: Never

## 2018-10-17 DIAGNOSIS — S63656A Sprain of metacarpophalangeal joint of right little finger, initial encounter: Secondary | ICD-10-CM | POA: Insufficient documentation

## 2018-10-17 NOTE — Telephone Encounter (Signed)
Faxed office note and work note 

## 2018-12-03 ENCOUNTER — Encounter (HOSPITAL_COMMUNITY): Payer: Self-pay | Admitting: Emergency Medicine

## 2018-12-03 ENCOUNTER — Emergency Department (HOSPITAL_COMMUNITY)
Admission: EM | Admit: 2018-12-03 | Discharge: 2018-12-03 | Disposition: A | Discharge status: Home / Self Care | Payer: Self-pay | Attending: Emergency Medicine | Admitting: Emergency Medicine

## 2018-12-03 ENCOUNTER — Other Ambulatory Visit: Payer: Self-pay

## 2018-12-03 DIAGNOSIS — M545 Low back pain, unspecified: Secondary | ICD-10-CM

## 2018-12-03 DIAGNOSIS — I10 Essential (primary) hypertension: Secondary | ICD-10-CM | POA: Insufficient documentation

## 2018-12-03 DIAGNOSIS — J45909 Unspecified asthma, uncomplicated: Secondary | ICD-10-CM | POA: Insufficient documentation

## 2018-12-03 DIAGNOSIS — Z79899 Other long term (current) drug therapy: Secondary | ICD-10-CM | POA: Insufficient documentation

## 2018-12-03 DIAGNOSIS — R102 Pelvic and perineal pain: Secondary | ICD-10-CM | POA: Insufficient documentation

## 2018-12-03 LAB — WET PREP, GENITAL
CLUE CELLS WET PREP: NONE SEEN
SPERM: NONE SEEN
Trich, Wet Prep: NONE SEEN
Yeast Wet Prep HPF POC: NONE SEEN

## 2018-12-03 LAB — LIPASE, BLOOD: Lipase: 32 U/L (ref 11–51)

## 2018-12-03 LAB — URINALYSIS, ROUTINE W REFLEX MICROSCOPIC
Bacteria, UA: NONE SEEN
Bilirubin Urine: NEGATIVE
GLUCOSE, UA: NEGATIVE mg/dL
Ketones, ur: NEGATIVE mg/dL
Leukocytes,Ua: NEGATIVE
NITRITE: NEGATIVE
PH: 5 (ref 5.0–8.0)
PROTEIN: NEGATIVE mg/dL
RBC / HPF: >50 RBC/hpf — ABNORMAL HIGH (ref 0–5)
Specific Gravity, Urine: 1.017 (ref 1.005–1.030)

## 2018-12-03 LAB — COMPREHENSIVE METABOLIC PANEL
ALK PHOS: 73 U/L (ref 38–126)
ALT: 15 U/L (ref 0–44)
AST: 18 U/L (ref 15–41)
Albumin: 3.6 g/dL (ref 3.5–5.0)
Anion gap: 7 (ref 5–15)
BUN: 7 mg/dL (ref 6–20)
CO2: 24 mmol/L (ref 22–32)
CREATININE: 0.76 mg/dL (ref 0.44–1.00)
Calcium: 9.1 mg/dL (ref 8.9–10.3)
Chloride: 108 mmol/L (ref 98–111)
Glucose, Bld: 90 mg/dL (ref 70–99)
Potassium: 4.2 mmol/L (ref 3.5–5.1)
SODIUM: 139 mmol/L (ref 135–145)
Total Bilirubin: 0.2 mg/dL — ABNORMAL LOW (ref 0.3–1.2)
Total Protein: 6.6 g/dL (ref 6.5–8.1)

## 2018-12-03 LAB — CBC
HCT: 38.2 % (ref 36.0–46.0)
HEMOGLOBIN: 11.8 g/dL — AB (ref 12.0–15.0)
MCH: 24.2 pg — ABNORMAL LOW (ref 26.0–34.0)
MCHC: 30.9 g/dL (ref 30.0–36.0)
MCV: 78.3 fL — ABNORMAL LOW (ref 80.0–100.0)
Platelets: 413 10*3/uL — ABNORMAL HIGH (ref 150–400)
RBC: 4.88 MIL/uL (ref 3.87–5.11)
RDW: 16 % — ABNORMAL HIGH (ref 11.5–15.5)
WBC: 6.2 10*3/uL (ref 4.0–10.5)
nRBC: 0 % (ref 0.0–0.2)

## 2018-12-03 LAB — I-STAT BETA HCG BLOOD, ED (MC, WL, AP ONLY): I-stat hCG, quantitative: <5 m[IU]/mL (ref <5)

## 2018-12-03 MED ORDER — OXYCODONE-ACETAMINOPHEN 5-325 MG PO TABS
2.0000 | ORAL_TABLET | Freq: Once | ORAL | Status: AC
  Administered 2018-12-03: 2 via ORAL
  Filled 2018-12-03: qty 2

## 2018-12-03 MED ORDER — ONDANSETRON 4 MG PO TBDP
4.0000 mg | ORAL_TABLET | Freq: Once | ORAL | Status: AC
  Administered 2018-12-03: 4 mg via ORAL
  Filled 2018-12-03: qty 1

## 2018-12-03 MED ORDER — SODIUM CHLORIDE 0.9% FLUSH: 3.0000 mL | Freq: Once | INTRAVENOUS | Status: DC

## 2018-12-03 MED ORDER — MELOXICAM 15 MG PO TABS: 15.0000 mg | ORAL_TABLET | Freq: Every day | ORAL | 0 refills | Status: DC

## 2018-12-03 NOTE — ED Triage Notes (Signed)
Onset last night developed lower abdominal pain, pelvic pain, back pain, and started menstrual cycle yesterday with clots. States she is two weeks late and sexually active.

## 2018-12-03 NOTE — ED Notes (Addendum)
Pt stated her stomach was hurting, however eating food.

## 2018-12-03 NOTE — ED Provider Notes (Signed)
[facility] EMERGENCY DEPARTMENT Provider Note   CSN: [id number] Arrival date & time: 12/03/18  1440    History   Chief Complaint Chief Complaint  Patient presents with  . Abdominal Pain  . Back Pain  . Vaginal Bleeding    HPI Sheila Garner is a 43 y.o. female. Who presents the emergency department chief complaint of abdominal and back pain.  Patient states that she is currently menstruating and frequently gets very bad cramps however this feels different to her and she is also having back pain.  Onset of her symptoms was sometime at night.  She awoken from sleep with suprapubic abdominal pain and bilateral flank pain.  She feels like her pain is worse when she sits up but is unchanged with specific movement.  She has had urinary frequency.  She denies fevers or chills, dysuria.  She is having heavy clotting today.     HPI  Past Medical History:  Diagnosis Date  . Asthma   . Hypertension     Patient Active Problem List   Diagnosis Date Noted  . Sprain of metacarpophalangeal joint of right little finger 10/17/2018  . Closed fracture of tuft of distal phalanx of finger 08/02/2018    Past Surgical History:  Procedure Laterality Date  . CHOLECYSTECTOMY  2012  . KNEE SURGERY Left      OB History   No obstetric history on file.      Home Medications    Prior to Admission medications   Medication Sig Start Date End Date Taking? Authorizing Provider  amLODipine (NORVASC) 5 MG tablet Take 1 tablet (5 mg total) by mouth daily. 06/18/18   Wallis Bamberg, PA-C  amoxicillin (AMOXIL) 500 MG capsule Take 1 capsule (500 mg total) by mouth 3 (three) times daily. 06/18/18   Wallis Bamberg, PA-C  fluconazole (DIFLUCAN) 150 MG tablet Take 1 tablet (150 mg total) by mouth once a week. 06/18/18   Wallis Bamberg, PA-C  lisinopril (PRINIVIL,ZESTRIL) 10 MG tablet Take 1 tablet (10 mg total) by mouth daily. 09/06/17   Elvina Sidle, MD  rizatriptan (MAXALT) 10 MG tablet Take 1  tablet (10 mg total) by mouth once as needed for migraine. May repeat in 2 hours if needed 04/10/18 05/10/18  Darci Current, MD  topiramate (TOPAMAX) 25 MG tablet Take 1 tablet (25 mg total) by mouth at bedtime. 04/10/18 05/10/18  Darci Current, MD    Family History Family History  Problem Relation Age of Onset  . Heart failure Mother   . Diabetes Mother   . Hypertension Mother   . Diabetes Father   . Hypertension Father   . Stroke Father     Social History Social History   Tobacco Use  . Smoking status: Current Every Day Smoker    Packs/day: 0.25    Years: 15.00    Pack years: 3.75    Types: Cigarettes  . Smokeless tobacco: Never Used  Substance Use Topics  . Alcohol use: Yes    Comment: socially  . Drug use: No     Allergies   Latex   Review of Systems Review of Systems   Physical Exam Updated Vital Signs BP (!) 145/94 (BP Location: Right Arm)   Pulse 91   Temp 98.8 F (37.1 C) (Oral)   Resp 18   Ht 5\' 4"  (1.626 m)   Wt 79.4 kg   LMP 12/02/2018   SpO2 100%   BMI 30.04 kg/m   Physical Exam Vitals  signs and nursing note reviewed.  Constitutional:      General: She is not in acute distress.    Appearance: She is well-developed. She is not diaphoretic.  HENT:     Head: Normocephalic and atraumatic.  Eyes:     General: No scleral icterus.    Conjunctiva/sclera: Conjunctivae normal.  Neck:     Musculoskeletal: Normal range of motion.  Cardiovascular:     Rate and Rhythm: Normal rate and regular rhythm.     Heart sounds: Normal heart sounds. No murmur. No friction rub. No gallop.   Pulmonary:     Effort: Pulmonary effort is normal. No respiratory distress.     Breath sounds: Normal breath sounds.  Abdominal:     General: Bowel sounds are normal. There is no distension.     Palpations: Abdomen is soft. There is no mass.     Tenderness: There is abdominal tenderness in the suprapubic area. There is right CVA tenderness and left CVA tenderness. There  is no guarding.  Genitourinary:    Comments: Pelvic exam: normal external genitalia, vulva, vagina, cervix, uterus and adnexa, menstrual flow present.  Skin:    General: Skin is warm and dry.  Neurological:     Mental Status: She is alert and oriented to person, place, and time.  Psychiatric:        Behavior: Behavior normal.      ED Treatments / Results  Labs (all labs ordered are listed, but only abnormal results are displayed) Labs Reviewed  COMPREHENSIVE METABOLIC PANEL - Abnormal; Notable for the following components:      Result Value   Total Bilirubin 0.2 (*)    All other components within normal limits  CBC - Abnormal; Notable for the following components:   Hemoglobin 11.8 (*)    MCV 78.3 (*)    MCH 24.2 (*)    RDW 16.0 (*)    Platelets 413 (*)    All other components within normal limits  LIPASE, BLOOD  URINALYSIS, ROUTINE W REFLEX MICROSCOPIC  I-STAT BETA HCG BLOOD, ED ([facility], [facility], [facility] ONLY)  I-STAT BETA HCG BLOOD, ED ([facility], [facility], [facility] ONLY)    EKG None  Radiology No results found.  Procedures Procedures (including critical care time)  Medications Ordered in ED Medications  sodium chloride flush (NS) 0.9 % injection 3 mL (has no administration in time range)     Initial Impression / Assessment and Plan / ED Course  I have reviewed the triage vital signs and the nursing notes.  Pertinent labs & imaging results that were available during my care of the patient were reviewed by me and considered in my medical decision making (see chart for details).        Patient with benign pelvic exam, benign abdominal exam.  Pain improved after pain treatment here in the emergency department.  Feel her symptoms are very likely secondary to her menstrual cycle.  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  She did not appear to have a urinary tract infection.  I doubt any intra-abdominal or intrapelvic infections or other abnormalities as the patient  has no unilateral tenderness and a benign exam after pain medication.  Patient appears appropriate for discharge at this time.  She should follow closely with her PCP or GYN.  Discussed return precautions  Final Clinical Impressions(s) / ED Diagnoses   Final diagnoses:  None    ED Discharge Orders    None  Arthor Captain, PA-C 12/03/18 2345    Pricilla Loveless, MD 12/07/18 [phone]

## 2018-12-03 NOTE — Discharge Instructions (Signed)
You were given narcotic and or sedative medications while in the emergency department. Do not drive. Do not use machinery or power tools. Do not sign legal documents. Do not drink alcohol. Do not take sleeping pills. Do not supervise children by yourself. Do not participate in activities that require climbing or being in high places.  Abdominal (belly) pain can be caused by many things. Your caregiver performed an examination and possibly ordered blood/urine tests and imaging (CT scan, x-rays, ultrasound). Many cases can be observed and treated at home after initial evaluation in the emergency department. Even though you are being discharged home, abdominal pain can be unpredictable. Therefore, you need a repeated exam if your pain does not resolve, returns, or worsens. Most patients with abdominal pain don't have to be admitted to the hospital or have surgery, but serious problems like appendicitis and gallbladder attacks can start out as nonspecific pain. Many abdominal conditions cannot be diagnosed in one visit, so follow-up evaluations are very important. SEEK IMMEDIATE MEDICAL ATTENTION IF: The pain does not go away or becomes severe.  A temperature above 101 develops.  Repeated vomiting occurs (multiple episodes).  The pain becomes localized to portions of the abdomen. The right side could possibly be appendicitis. In an adult, the left lower portion of the abdomen could be colitis or diverticulitis.  Blood is being passed in stools or vomit (bright red or black tarry stools).  Return also if you develop chest pain, difficulty breathing, dizziness or fainting, or become confused, poorly responsive, or inconsolable (young children).

## 2018-12-03 NOTE — ED Notes (Signed)
RN informed it's ok for Pt to receive outside food delivery to food

## 2018-12-05 LAB — GC/CHLAMYDIA PROBE AMP (~~LOC~~) NOT AT ARMC
Chlamydia: NEGATIVE
Neisseria Gonorrhea: NEGATIVE

## 2019-03-16 ENCOUNTER — Emergency Department
Admission: EM | Admit: 2019-03-16 | Discharge: 2019-03-16 | Disposition: A | Discharge status: Home / Self Care | Payer: HRSA Program | Attending: Emergency Medicine | Admitting: Emergency Medicine

## 2019-03-16 ENCOUNTER — Emergency Department: Payer: HRSA Program

## 2019-03-16 ENCOUNTER — Encounter: Payer: Self-pay | Admitting: Emergency Medicine

## 2019-03-16 ENCOUNTER — Other Ambulatory Visit: Payer: Self-pay

## 2019-03-16 DIAGNOSIS — J3489 Other specified disorders of nose and nasal sinuses: Secondary | ICD-10-CM | POA: Diagnosis not present

## 2019-03-16 DIAGNOSIS — Z79899 Other long term (current) drug therapy: Secondary | ICD-10-CM | POA: Insufficient documentation

## 2019-03-16 DIAGNOSIS — I10 Essential (primary) hypertension: Secondary | ICD-10-CM | POA: Diagnosis not present

## 2019-03-16 DIAGNOSIS — R0981 Nasal congestion: Secondary | ICD-10-CM | POA: Diagnosis not present

## 2019-03-16 DIAGNOSIS — Z91040 Latex allergy status: Secondary | ICD-10-CM | POA: Diagnosis not present

## 2019-03-16 DIAGNOSIS — Z20828 Contact with and (suspected) exposure to other viral communicable diseases: Secondary | ICD-10-CM | POA: Insufficient documentation

## 2019-03-16 DIAGNOSIS — J45909 Unspecified asthma, uncomplicated: Secondary | ICD-10-CM | POA: Insufficient documentation

## 2019-03-16 DIAGNOSIS — F17210 Nicotine dependence, cigarettes, uncomplicated: Secondary | ICD-10-CM | POA: Diagnosis not present

## 2019-03-16 DIAGNOSIS — J029 Acute pharyngitis, unspecified: Secondary | ICD-10-CM | POA: Insufficient documentation

## 2019-03-16 LAB — SARS CORONAVIRUS 2 BY RT PCR (HOSPITAL ORDER, PERFORMED IN ~~LOC~~ HOSPITAL LAB): SARS Coronavirus 2: NEGATIVE

## 2019-03-16 LAB — GROUP A STREP BY PCR: Group A Strep by PCR: NOT DETECTED

## 2019-03-16 MED ORDER — MAGIC MOUTHWASH
10.0000 mL | Freq: Once | ORAL | Status: AC
  Administered 2019-03-16: 10 mL via ORAL
  Filled 2019-03-16: qty 10

## 2019-03-16 MED ORDER — AMOXICILLIN 500 MG PO CAPS: 500.0000 mg | ORAL_CAPSULE | Freq: Three times a day (TID) | ORAL | 0 refills | Status: DC

## 2019-03-16 MED ORDER — MAGIC MOUTHWASH: 5.0000 mL | Freq: Three times a day (TID) | ORAL | 0 refills | Status: DC | PRN

## 2019-03-16 NOTE — ED Provider Notes (Signed)
[facility] Emergency Department Provider Note   ____________________________________________   First MD Initiated Contact with Patient 03/16/19 [phone]     (approximate)  I have reviewed the triage vital signs and the nursing notes.   HISTORY  Chief Complaint Sore Throat    HPI Sheila Garner is a 43 y.o. female who presents to the ED from home with a chief complaint of sore throat.  Patient reports waking up with a sore throat yesterday morning.  States it has been getting progressive throughout the day.  Reports there are 2 people at her work who have been diagnosed with COVID-19 but she has not had any direct contact with them.  Denies fever, cough, chest pain, shortness breath, abdominal pain, nausea or vomiting.  Denies recent travel or trauma.       Past Medical History:  Diagnosis Date  . Asthma   . Hypertension     Patient Active Problem List   Diagnosis Date Noted  . Sprain of metacarpophalangeal joint of right little finger 10/17/2018  . Closed fracture of tuft of distal phalanx of finger 08/02/2018    Past Surgical History:  Procedure Laterality Date  . CHOLECYSTECTOMY  2012  . KNEE SURGERY Left     Prior to Admission medications   Medication Sig Start Date End Date Taking? Authorizing Provider  amLODipine (NORVASC) 5 MG tablet Take 1 tablet (5 mg total) by mouth daily. 06/18/18   Jaynee Eagles, PA-C  amoxicillin (AMOXIL) 500 MG capsule Take 1 capsule (500 mg total) by mouth 3 (three) times daily. 03/16/19   Paulette Blanch, MD  fluconazole (DIFLUCAN) 150 MG tablet Take 1 tablet (150 mg total) by mouth once a week. Patient not taking: Reported on 12/03/2018 06/18/18   Jaynee Eagles, PA-C  ibuprofen (ADVIL,MOTRIN) 200 MG tablet Take 200 mg by mouth every 6 (six) hours as needed for pain.    [provider]  lisinopril (PRINIVIL,ZESTRIL) 10 MG tablet Take 1 tablet (10 mg total) by mouth daily. 09/06/17   Robyn Haber, MD  magic mouthwash  SOLN Take 5 mLs by mouth 3 (three) times daily as needed for mouth pain. 03/16/19   Paulette Blanch, MD  meloxicam (MOBIC) 15 MG tablet Take 1 tablet (15 mg total) by mouth daily. Take 1 daily with food. 12/03/18   Margarita Mail, PA-C    Allergies Latex  Family History  Problem Relation Age of Onset  . Heart failure Mother   . Diabetes Mother   . Hypertension Mother   . Diabetes Father   . Hypertension Father   . Stroke Father     Social History Social History   Tobacco Use  . Smoking status: Current Every Day Smoker    Packs/day: 0.25    Years: 15.00    Pack years: 3.75    Types: Cigarettes  . Smokeless tobacco: Never Used  Substance Use Topics  . Alcohol use: Yes    Comment: socially  . Drug use: No    Review of Systems  Constitutional: No fever/chills Eyes: No visual changes. ENT: Positive for sore throat and runny nose. Cardiovascular: Denies chest pain. Respiratory: Denies shortness of breath. Gastrointestinal: No abdominal pain.  No nausea, no vomiting.  No diarrhea.  No constipation. Genitourinary: Negative for dysuria. Musculoskeletal: Negative for back pain. Skin: Negative for rash. Neurological: Negative for headaches, focal weakness or numbness.   ____________________________________________   PHYSICAL EXAM:  VITAL SIGNS: ED Triage Vitals  Enc Vitals Group  BP 03/16/19 0046 (!) 156/107     Pulse Rate 03/16/19 0046 90     Resp 03/16/19 0046 18     Temp 03/16/19 0046 98.3 F (36.8 C)     Temp Source 03/16/19 0046 Oral     SpO2 03/16/19 0046 99 %     Weight 03/16/19 0047 180 lb (81.6 kg)     Height 03/16/19 0047 5\' 4"  (1.626 m)     Head Circumference --      Peak Flow --      Pain Score 03/16/19 0047 7     Pain Loc --      Pain Edu? --      Excl. in GC? --     Constitutional: Alert and oriented. Well appearing and in no acute distress. Eyes: Conjunctivae are normal. PERRL. EOMI. Head: Atraumatic. Nose: Congestion/rhinnorhea.  Mouth/Throat: Mucous membranes are moist.  Oropharynx mildly erythematous without tonsillar swelling, exudates or peritonsillar abscess.  No hoarse or muffled voice.  There is no drooling. Neck: No stridor.  Supple neck without meningismus. Hematological/Lymphatic/Immunilogical: No cervical lymphadenopathy. Cardiovascular: Normal rate, regular rhythm. Grossly normal heart sounds.  Good peripheral circulation. Respiratory: Normal respiratory effort.  No retractions. Lungs CTAB. Gastrointestinal: Soft and nontender. No distention. No abdominal bruits. No CVA tenderness. Musculoskeletal: No lower extremity tenderness nor edema.  No joint effusions. Neurologic:  Normal speech and language. No gross focal neurologic deficits are appreciated. No gait instability. Skin:  Skin is warm, dry and intact. No rash noted. Psychiatric: Mood and affect are normal. Speech and behavior are normal.  ____________________________________________   LABS (all labs ordered are listed, but only abnormal results are displayed)  Labs Reviewed  GROUP A STREP BY PCR  SARS CORONAVIRUS 2 (HOSPITAL ORDER, PERFORMED IN [place] [facility])   ____________________________________________  EKG  None ____________________________________________  RADIOLOGY  ED MD interpretation: No acute cardiopulmonary process  Official radiology report(s): Dg Chest Port 1 View  Result Date: 03/16/2019 CLINICAL DATA:  Sore throat. EXAM: PORTABLE CHEST 1 VIEW COMPARISON:  06/23/2018 FINDINGS: The heart size and mediastinal contours are within normal limits. Both lungs are clear. The visualized skeletal structures are unremarkable. IMPRESSION: No active disease. Electronically Signed   By: Katherine Mantlehristopher  Green M.D.   On: 03/16/2019 01:52    ____________________________________________   PROCEDURES  Procedure(s) performed (including Critical Care):  Procedures   ____________________________________________   INITIAL  IMPRESSION / ASSESSMENT AND PLAN / ED COURSE  As part of my medical decision making, I reviewed the following data within the electronic MEDICAL RECORD NUMBER Nursing notes reviewed and incorporated, Labs reviewed, Old chart reviewed, Radiograph reviewed and Notes from prior ED visits     Terrence DupontMelinda Ocheltree was evaluated in Emergency Department on 03/16/2019 for the symptoms described in the history of present illness. She was evaluated in the context of the global COVID-19 pandemic, which necessitated consideration that the patient might be at risk for infection with the SARS-CoV-2 virus that causes COVID-19. Institutional protocols and algorithms that pertain to the evaluation of patients at risk for COVID-19 are in a state of rapid change based on information released by regulatory bodies including the CDC and federal and state organizations. These policies and algorithms were followed during the patient's care in the ED.   43 year old female who presents with sore throat.  Coworkers with COVID-19.  Will obtain rapid strep, COVID test and x-ray.  Clinical Course as of Mar 16 243  Sat Mar 16, 2019  0242 Updated patient of  all test results.  Will discharge home with prescription for antibiotic but asked patient not to start for 48 hours since most pharyngitis is viral.  Also prescription for Magic mouthwash.  Strict return precautions given.  Patient verbalizes understanding and agrees with plan of care.   [JS]    Clinical Course User Index [JS] Irean HongSung, Adin Lariccia J, MD     ____________________________________________   FINAL CLINICAL IMPRESSION(S) / ED DIAGNOSES  Final diagnoses:  Pharyngitis, unspecified etiology  Sore throat     ED Discharge Orders         Ordered    amoxicillin (AMOXIL) 500 MG capsule  3 times daily     03/16/19 0243    magic mouthwash SOLN  3 times daily PRN     03/16/19 0243           Note:  This document was prepared using Dragon voice recognition software and may  include unintentional dictation errors.   Irean HongSung, Becci Batty J, MD 03/16/19 [phone]

## 2019-03-16 NOTE — ED Triage Notes (Signed)
Patient states that she woke up with a sore throat this morning. Patient states that it has been getting worse throughout the day. Patient states that there are 2 people at work diagnosed with Covid 19.

## 2019-03-16 NOTE — Discharge Instructions (Addendum)
1.  You may use Magic mouthwash as needed for throat discomfort. 2.  You may start antibiotic (Amoxicillin) if not better in 2 days. 3.  Return to the ER for worsening symptoms, persistent vomiting, difficulty breathing or other concerns.

## 2019-06-05 ENCOUNTER — Encounter: Payer: Self-pay | Admitting: Emergency Medicine

## 2019-06-05 ENCOUNTER — Other Ambulatory Visit: Payer: Self-pay

## 2019-06-05 ENCOUNTER — Emergency Department
Admission: EM | Admit: 2019-06-05 | Discharge: 2019-06-05 | Disposition: A | Discharge status: Home / Self Care | Payer: Medicaid Other | Attending: Emergency Medicine | Admitting: Emergency Medicine

## 2019-06-05 DIAGNOSIS — I10 Essential (primary) hypertension: Secondary | ICD-10-CM | POA: Diagnosis not present

## 2019-06-05 DIAGNOSIS — Y990 Civilian activity done for income or pay: Secondary | ICD-10-CM | POA: Insufficient documentation

## 2019-06-05 DIAGNOSIS — S161XXA Strain of muscle, fascia and tendon at neck level, initial encounter: Secondary | ICD-10-CM | POA: Diagnosis not present

## 2019-06-05 DIAGNOSIS — F17210 Nicotine dependence, cigarettes, uncomplicated: Secondary | ICD-10-CM | POA: Insufficient documentation

## 2019-06-05 DIAGNOSIS — J45909 Unspecified asthma, uncomplicated: Secondary | ICD-10-CM | POA: Diagnosis not present

## 2019-06-05 DIAGNOSIS — X500XXA Overexertion from strenuous movement or load, initial encounter: Secondary | ICD-10-CM | POA: Diagnosis not present

## 2019-06-05 DIAGNOSIS — Y9389 Activity, other specified: Secondary | ICD-10-CM | POA: Insufficient documentation

## 2019-06-05 DIAGNOSIS — Z79899 Other long term (current) drug therapy: Secondary | ICD-10-CM | POA: Diagnosis not present

## 2019-06-05 DIAGNOSIS — Y9289 Other specified places as the place of occurrence of the external cause: Secondary | ICD-10-CM | POA: Insufficient documentation

## 2019-06-05 DIAGNOSIS — S199XXA Unspecified injury of neck, initial encounter: Secondary | ICD-10-CM | POA: Diagnosis present

## 2019-06-05 MED ORDER — PREDNISONE 10 MG PO TABS: 10.0000 mg | ORAL_TABLET | Freq: Every day | ORAL | 0 refills | Status: DC

## 2019-06-05 MED ORDER — PREDNISONE 20 MG PO TABS
60.0000 mg | ORAL_TABLET | Freq: Once | ORAL | Status: AC
  Administered 2019-06-05: 60 mg via ORAL
  Filled 2019-06-05: qty 3

## 2019-06-05 NOTE — ED Provider Notes (Signed)
[facility] EMERGENCY DEPARTMENT Provider Note   CSN: [id number] Arrival date & time: 06/05/19  1744     History   Chief Complaint Chief Complaint  Patient presents with  . Neck Pain    HPI Sheila Garner is a 43 y.o. female reports to the emergency department evaluation of left-sided neck pain x1 month.  Worker's Comp. injury.  Was initially prescribed tizanidine and ibuprofen but ibuprofen caused stomach irritation.  She was initially getting better with only intermittent episodes of pain but today pain became more severe, states she was pushing working with a trailer and felt increased pain in the left side of her neck today at work.  She comes in today to be evaluating.  No numbness tingling radicular symptoms.  Pain is moderate.  She has tightness on the left side of the neck along the paravertebral muscles.  No trauma.    HPI  Past Medical History:  Diagnosis Date  . Asthma   . Hypertension     Patient Active Problem List   Diagnosis Date Noted  . Sprain of metacarpophalangeal joint of right little finger 10/17/2018  . Closed fracture of tuft of distal phalanx of finger 08/02/2018    Past Surgical History:  Procedure Laterality Date  . CHOLECYSTECTOMY  2012  . KNEE SURGERY Left      OB History   No obstetric history on file.      Home Medications    Prior to Admission medications   Medication Sig Start Date End Date Taking? Authorizing Provider  amLODipine (NORVASC) 5 MG tablet Take 1 tablet (5 mg total) by mouth daily. 06/18/18   Wallis Bamberg, PA-C  amoxicillin (AMOXIL) 500 MG capsule Take 1 capsule (500 mg total) by mouth 3 (three) times daily. 03/16/19   Irean Hong, MD  fluconazole (DIFLUCAN) 150 MG tablet Take 1 tablet (150 mg total) by mouth once a week. Patient not taking: Reported on 12/03/2018 06/18/18   Wallis Bamberg, PA-C  ibuprofen (ADVIL,MOTRIN) 200 MG tablet Take 200 mg by mouth every 6 (six) hours as needed for pain.    [provider]  lisinopril (PRINIVIL,ZESTRIL) 10 MG tablet Take 1 tablet (10 mg total) by mouth daily. 09/06/17   Elvina Sidle, MD  magic mouthwash SOLN Take 5 mLs by mouth 3 (three) times daily as needed for mouth pain. 03/16/19   Irean Hong, MD  meloxicam (MOBIC) 15 MG tablet Take 1 tablet (15 mg total) by mouth daily. Take 1 daily with food. 12/03/18   Harris, Cammy Copa, PA-C  predniSONE (DELTASONE) 10 MG tablet Take 1 tablet (10 mg total) by mouth daily. 6,5,4,3,2,1 six day taper 06/05/19   Evon Slack, PA-C    Family History Family History  Problem Relation Age of Onset  . Heart failure Mother   . Diabetes Mother   . Hypertension Mother   . Diabetes Father   . Hypertension Father   . Stroke Father     Social History Social History   Tobacco Use  . Smoking status: Current Every Day Smoker    Packs/day: 0.25    Years: 15.00    Pack years: 3.75    Types: Cigarettes  . Smokeless tobacco: Never Used  Substance Use Topics  . Alcohol use: Yes    Comment: socially  . Drug use: No     Allergies   Latex   Review of Systems Review of Systems  Constitutional: Negative for fever.  Musculoskeletal: Positive for myalgias  and neck pain. Negative for neck stiffness.  Neurological: Negative for weakness, numbness and headaches.     Physical Exam Updated Vital Signs BP (!) 173/94 (BP Location: Left Arm) Comment: Patient states this is normal bp for her  Pulse 83   Temp 98.5 F (36.9 C) (Oral)   Resp 16   Ht 5\' 4"  (1.626 m)   Wt 81.6 kg   SpO2 100%   BMI 30.90 kg/m   Physical Exam Constitutional:      Appearance: Normal appearance. She is well-developed.  HENT:     Head: Normocephalic and atraumatic.  Eyes:     Conjunctiva/sclera: Conjunctivae normal.  Neck:     Musculoskeletal: Normal range of motion.  Cardiovascular:     Rate and Rhythm: Normal rate.  Pulmonary:     Effort: Pulmonary effort is normal. No respiratory distress.  Musculoskeletal: Normal  range of motion.     Comments: Cervical Spine: Examination of the cervical spine reveals no bony abnormality, no edema, and no ecchymosis.  There is no step-off.  Limited range of motion cervical spine with left and right rotation.  Normal flexion extension.  There is no crepitus with range of motion exercises.  The patient is non-tender along the spinous process to palpation.  Left paravertebral muscle tenderness along the trapezius..  There is no parascapular discomfort.  The patient has a negative axial compression test.  The patient has a negative Spurling test. Left upper Extremity: Examination of the left shoulder and arm showed no bony abnormality or edema.  The patient has normal active and passive motion with abduction, flexion, internal rotation, and external rotation.  The patient has no tenderness with motion.  The patient has a negative Hawkins test and a negative impingement test.  The patient has a negative drop arm test.  The patient is non-tender along the deltoid muscle.  There is no subacromial space tenderness with no AC joint tenderness.  The patient has no instability of the shoulder with anterior-posterior motion.  There is a negative sulcus sign.  The rotator cuff muscle strength is 5/5 with supraspinatus, 5/5 with internal rotation, and 5/5 with external rotation.  There is no crepitus with range of motion activities.      Skin:    General: Skin is warm.     Findings: No rash.  Neurological:     Mental Status: She is alert and oriented to person, place, and time.  Psychiatric:        Behavior: Behavior normal.        Thought Content: Thought content normal.      ED Treatments / Results  Labs (all labs ordered are listed, but only abnormal results are displayed) Labs Reviewed - No data to display  EKG None  Radiology No results found.  Procedures Procedures (including critical care time)  Medications Ordered in ED Medications  predniSONE (DELTASONE) tablet 60  mg (has no administration in time range)     Initial Impression / Assessment and Plan / ED Course  I have reviewed the triage vital signs and the nursing notes.  Pertinent labs & imaging results that were available during my care of the patient were reviewed by me and considered in my medical decision making (see chart for details).        43 year old female with left-sided cervical muscle strain.  No neurological deficits.  History and exam consistent with strain.  No new acute trauma or injury.  She will continue with tizanidine.  Unable  to take ibuprofen so we will give 6-day steroid taper.  Encourage patient to discuss PT with case manager.  She understands signs symptoms return to ED for.  Final Clinical Impressions(s) / ED Diagnoses   Final diagnoses:  Acute strain of neck muscle, initial encounter    ED Discharge Orders         Ordered    predniSONE (DELTASONE) 10 MG tablet  Daily     06/05/19 1831           Renata Caprice 06/05/19 1856    Lavonia Drafts, MD 06/05/19 1924

## 2019-06-05 NOTE — ED Triage Notes (Signed)
Patient presents to the ED with left sided neck/back pain that began with a workman's comp injury in July.  Patient states approx. 2 weeks ago her doctor advised her to try to, "do more" with her left shoulder/arm and now pain is constant and severe.  Patient is in no obvious distress at this time.

## 2019-06-05 NOTE — Discharge Instructions (Signed)
Please take medications as prescribed.  You may take tizanidine with prednisone.  Return to the ER for any worsening symptoms or urgent changes in health such as weakness numbness tingling or fevers.  Call case manager to see if you can get set up with physical therapy.

## 2019-08-06 ENCOUNTER — Other Ambulatory Visit: Payer: Self-pay

## 2019-08-07 ENCOUNTER — Ambulatory Visit: Payer: Self-pay

## 2019-08-30 ENCOUNTER — Emergency Department (HOSPITAL_COMMUNITY)
Admission: EM | Admit: 2019-08-30 | Discharge: 2019-08-30 | Disposition: A | Discharge status: Home / Self Care | Payer: Medicaid Other | Attending: Emergency Medicine | Admitting: Emergency Medicine

## 2019-08-30 ENCOUNTER — Other Ambulatory Visit: Payer: Self-pay

## 2019-08-30 DIAGNOSIS — Z79899 Other long term (current) drug therapy: Secondary | ICD-10-CM | POA: Insufficient documentation

## 2019-08-30 DIAGNOSIS — R109 Unspecified abdominal pain: Secondary | ICD-10-CM | POA: Diagnosis present

## 2019-08-30 DIAGNOSIS — I10 Essential (primary) hypertension: Secondary | ICD-10-CM | POA: Diagnosis not present

## 2019-08-30 DIAGNOSIS — Z87442 Personal history of urinary calculi: Secondary | ICD-10-CM | POA: Insufficient documentation

## 2019-08-30 DIAGNOSIS — F17210 Nicotine dependence, cigarettes, uncomplicated: Secondary | ICD-10-CM | POA: Diagnosis not present

## 2019-08-30 DIAGNOSIS — R300 Dysuria: Secondary | ICD-10-CM | POA: Diagnosis not present

## 2019-08-30 DIAGNOSIS — J45909 Unspecified asthma, uncomplicated: Secondary | ICD-10-CM | POA: Insufficient documentation

## 2019-08-30 LAB — BASIC METABOLIC PANEL
Anion gap: 11 (ref 5–15)
BUN: 8 mg/dL (ref 6–20)
CO2: 23 mmol/L (ref 22–32)
Calcium: 9.5 mg/dL (ref 8.9–10.3)
Chloride: 107 mmol/L (ref 98–111)
Creatinine, Ser: 0.74 mg/dL (ref 0.44–1.00)
GFR calc Af Amer: >60 mL/min (ref >60)
GFR calc non Af Amer: >60 mL/min (ref >60)
Glucose, Bld: 93 mg/dL (ref 70–99)
Potassium: 3.8 mmol/L (ref 3.5–5.1)
Sodium: 141 mmol/L (ref 135–145)

## 2019-08-30 LAB — CBC
HCT: 38.2 % (ref 36.0–46.0)
Hemoglobin: 12 g/dL (ref 12.0–15.0)
MCH: 24.5 pg — ABNORMAL LOW (ref 26.0–34.0)
MCHC: 31.4 g/dL (ref 30.0–36.0)
MCV: 78 fL — ABNORMAL LOW (ref 80.0–100.0)
Platelets: 424 10*3/uL — ABNORMAL HIGH (ref 150–400)
RBC: 4.9 MIL/uL (ref 3.87–5.11)
RDW: 15.4 % (ref 11.5–15.5)
WBC: 6.1 10*3/uL (ref 4.0–10.5)
nRBC: 0 % (ref 0.0–0.2)

## 2019-08-30 LAB — URINALYSIS, ROUTINE W REFLEX MICROSCOPIC
Bacteria, UA: NONE SEEN
Bilirubin Urine: NEGATIVE
Glucose, UA: NEGATIVE mg/dL
Ketones, ur: NEGATIVE mg/dL
Leukocytes,Ua: NEGATIVE
Nitrite: NEGATIVE
Protein, ur: NEGATIVE mg/dL
Specific Gravity, Urine: 1.004 — ABNORMAL LOW (ref 1.005–1.030)
pH: 6 (ref 5.0–8.0)

## 2019-08-30 MED ORDER — ONDANSETRON 4 MG PO TBDP: 4.0000 mg | ORAL_TABLET | Freq: Three times a day (TID) | ORAL | 0 refills | Status: DC | PRN

## 2019-08-30 MED ORDER — PHENAZOPYRIDINE HCL 200 MG PO TABS: 200.0000 mg | ORAL_TABLET | Freq: Three times a day (TID) | ORAL | 0 refills | Status: DC

## 2019-08-30 MED ORDER — ONDANSETRON 4 MG PO TBDP
4.0000 mg | ORAL_TABLET | Freq: Once | ORAL | Status: AC
  Administered 2019-08-30: 13:00:00 4 mg via ORAL
  Filled 2019-08-30: qty 1

## 2019-08-30 MED ORDER — KETOROLAC TROMETHAMINE 30 MG/ML IJ SOLN
30.0000 mg | Freq: Once | INTRAMUSCULAR | Status: AC
  Administered 2019-08-30: 13:00:00 30 mg via INTRAMUSCULAR
  Filled 2019-08-30: qty 1

## 2019-08-30 MED ORDER — TAMSULOSIN HCL 0.4 MG PO CAPS: 0.4000 mg | ORAL_CAPSULE | Freq: Every day | ORAL | 0 refills | Status: DC

## 2019-08-30 NOTE — ED Provider Notes (Signed)
[place] EMERGENCY DEPARTMENT Provider Note   CSN: [id number] Arrival date & time: 08/30/19  1021     History   Chief Complaint Chief Complaint  Patient presents with  . Flank Pain    HPI Sheila Garner is a 43 y.o. female past medical history of asthma, hypertension, nephrolithiasis, presenting to the emergency department with complaint of urinary symptoms that began on Sunday.  Patient states initially she had a right flank pain that felt similar to prior kidney stone, however that pain significantly improved at home has only been mild and intermittent since Sunday.  She states she thought she may have been passing a stone, however then began having a "discomfort in her urethra" with urination with increased urinary frequency.  She also reports nausea without vomiting.  Denies abdominal pain, fever, or other associated symptoms.  She treated her symptoms with cranberry juice.     The history is provided by the patient.    Past Medical History:  Diagnosis Date  . Asthma   . Hypertension     Patient Active Problem List   Diagnosis Date Noted  . Sprain of metacarpophalangeal joint of right little finger 10/17/2018  . Closed fracture of tuft of distal phalanx of finger 08/02/2018    Past Surgical History:  Procedure Laterality Date  . CHOLECYSTECTOMY  2012  . KNEE SURGERY Left      OB History   No obstetric history on file.      Home Medications    Prior to Admission medications   Medication Sig Start Date End Date Taking? Authorizing Provider  amLODipine (NORVASC) 5 MG tablet Take 1 tablet (5 mg total) by mouth daily. 06/18/18   Jaynee Eagles, PA-C  amoxicillin (AMOXIL) 500 MG capsule Take 1 capsule (500 mg total) by mouth 3 (three) times daily. 03/16/19   Paulette Blanch, MD  fluconazole (DIFLUCAN) 150 MG tablet Take 1 tablet (150 mg total) by mouth once a week. Patient not taking: Reported on 12/03/2018 06/18/18   Jaynee Eagles, PA-C  ibuprofen  (ADVIL,MOTRIN) 200 MG tablet Take 200 mg by mouth every 6 (six) hours as needed for pain.    [provider]  lisinopril (PRINIVIL,ZESTRIL) 10 MG tablet Take 1 tablet (10 mg total) by mouth daily. 09/06/17   Robyn Haber, MD  magic mouthwash SOLN Take 5 mLs by mouth 3 (three) times daily as needed for mouth pain. 03/16/19   Paulette Blanch, MD  meloxicam (MOBIC) 15 MG tablet Take 1 tablet (15 mg total) by mouth daily. Take 1 daily with food. 12/03/18   Harris, Abigail, PA-C  ondansetron (ZOFRAN ODT) 4 MG disintegrating tablet Take 1 tablet (4 mg total) by mouth every 8 (eight) hours as needed for nausea or vomiting. 08/30/19   Lucciano Vitali, [place] N, PA-C  phenazopyridine (PYRIDIUM) 200 MG tablet Take 1 tablet (200 mg total) by mouth 3 (three) times daily. 08/30/19   Myiah Petkus, [place] N, PA-C  predniSONE (DELTASONE) 10 MG tablet Take 1 tablet (10 mg total) by mouth daily. 6,5,4,3,2,1 six day taper 06/05/19   Duanne Guess, PA-C  tamsulosin (FLOMAX) 0.4 MG CAPS capsule Take 1 capsule (0.4 mg total) by mouth daily. 08/30/19   Bellamy Rubey, [place] N, PA-C    Family History Family History  Problem Relation Age of Onset  . Heart failure Mother   . Diabetes Mother   . Hypertension Mother   . Diabetes Father   . Hypertension Father   . Stroke Father  Social History Social History   Tobacco Use  . Smoking status: Current Every Day Smoker    Packs/day: 0.25    Years: 15.00    Pack years: 3.75    Types: Cigarettes  . Smokeless tobacco: Never Used  Substance Use Topics  . Alcohol use: Yes    Comment: socially  . Drug use: No     Allergies   Latex   Review of Systems Review of Systems  Constitutional: Negative for fever.  Gastrointestinal: Positive for nausea. Negative for abdominal pain and vomiting.  Genitourinary: Positive for dysuria, flank pain and frequency. Negative for hematuria.  All other systems reviewed and are negative.    Physical Exam Updated Vital Signs BP  (!) 178/113 (BP Location: Left Arm)   Pulse 83   Temp 97.7 F (36.5 C) (Oral)   Resp 18   SpO2 100%   Physical Exam Vitals signs and nursing note reviewed.  Constitutional:      General: She is not in acute distress.    Appearance: She is well-developed. She is not ill-appearing.  HENT:     Head: Normocephalic and atraumatic.  Eyes:     Conjunctiva/sclera: Conjunctivae normal.  Cardiovascular:     Rate and Rhythm: Normal rate and regular rhythm.  Pulmonary:     Effort: Pulmonary effort is normal. No respiratory distress.     Breath sounds: Normal breath sounds.  Abdominal:     General: Bowel sounds are normal.     Palpations: Abdomen is soft.     Tenderness: There is no abdominal tenderness. There is no guarding or rebound.     Comments: Mild right flank tenderness to palpation  Skin:    General: Skin is warm.  Neurological:     Mental Status: She is alert.  Psychiatric:        Behavior: Behavior normal.      ED Treatments / Results  Labs (all labs ordered are listed, but only abnormal results are displayed) Labs Reviewed  CBC - Abnormal; Notable for the following components:      Result Value   MCV 78.0 (*)    MCH 24.5 (*)    Platelets 424 (*)    All other components within normal limits  URINALYSIS, ROUTINE W REFLEX MICROSCOPIC - Abnormal; Notable for the following components:   Color, Urine STRAW (*)    Specific Gravity, Urine 1.004 (*)    Hgb urine dipstick MODERATE (*)    All other components within normal limits  URINE CULTURE  BASIC METABOLIC PANEL    EKG None  Radiology No results found.  Procedures Procedures (including critical care time)  Medications Ordered in ED Medications  ondansetron (ZOFRAN-ODT) disintegrating tablet 4 mg (has no administration in time range)  ketorolac (TORADOL) 30 MG/ML injection 30 mg (has no administration in time range)     Initial Impression / Assessment and Plan / ED Course  I have reviewed the triage  vital signs and the nursing notes.  Pertinent labs & imaging results that were available during my care of the patient were reviewed by me and considered in my medical decision making (see chart for details).        Patient with history of nephrolithiasis, presenting to the emergency department with intermittent right flank pain with associated dysuria and urinary frequency since Sunday.  She also has some mild nausea without vomiting.  On exam she is very well-appearing and in no distress.  Vital signs reveal hypertension, however afebrile.  Abdomen is benign.  Urine with blood present, however no signs of infection.  Creatinine is within normal limits.  Suspect patient recently passed stone or is in the process of passing stone.  Discuss symptomatic management with patient and outpatient follow-up.  She is agreeable to plan.  Return precautions discussed.  Will discharge with Pyridium and Zofran for symptoms.  Safe for discharge.  Discussed results, findings, treatment and follow up. Patient advised of return precautions. Patient verbalized understanding and agreed with plan.  Final Clinical Impressions(s) / ED Diagnoses   Final diagnoses:  Right flank pain  Dysuria  History of nephrolithiasis    ED Discharge Orders         Ordered    phenazopyridine (PYRIDIUM) 200 MG tablet  3 times daily     08/30/19 1242    ondansetron (ZOFRAN ODT) 4 MG disintegrating tablet  Every 8 hours PRN     08/30/19 1242    tamsulosin (FLOMAX) 0.4 MG CAPS capsule  Daily     08/30/19 1245           Zared Knoth, [place] N, [place] JerseyPA-C 08/30/19 1256    Sabas SousBero, Michael M, MD 09/01/19 1507

## 2019-08-30 NOTE — Discharge Instructions (Signed)
Please read instructions below. Drink plenty of water. You can take pyridium for dysuria. Be aware this will turn your bodily secretions orange, this is a normal side effect of the medication. You can take Zofran every 8 hours as needed for nausea. Take the flomax daily to help with bladder spasm. Follow up with Urology if you have not passed the stone in the next few days. Return to the ER for fever, chills, uncontrollable vomiting, or worsening symptoms.

## 2019-08-30 NOTE — ED Triage Notes (Signed)
Pt arrives via POV from home with right flank pain since Sunday, off and on, with burning, frequency, urgency. VSS.

## 2019-08-30 NOTE — ED Notes (Signed)
Patient verbalizes understanding of discharge instructions . Opportunity for questions and answers were provided . Armband removed by staff ,Pt discharged from ED. W/C  offered at D/C  and Declined W/C at D/C and was escorted to lobby by RN.  

## 2019-08-31 IMAGING — CR DG CHEST 2V
2 series · 2 of 2 positions shown · non-contrast
Comparison: None.

CLINICAL DATA: Chest pain and shortness of breath.

EXAM:
CHEST - 2 VIEW

[w chest pa]
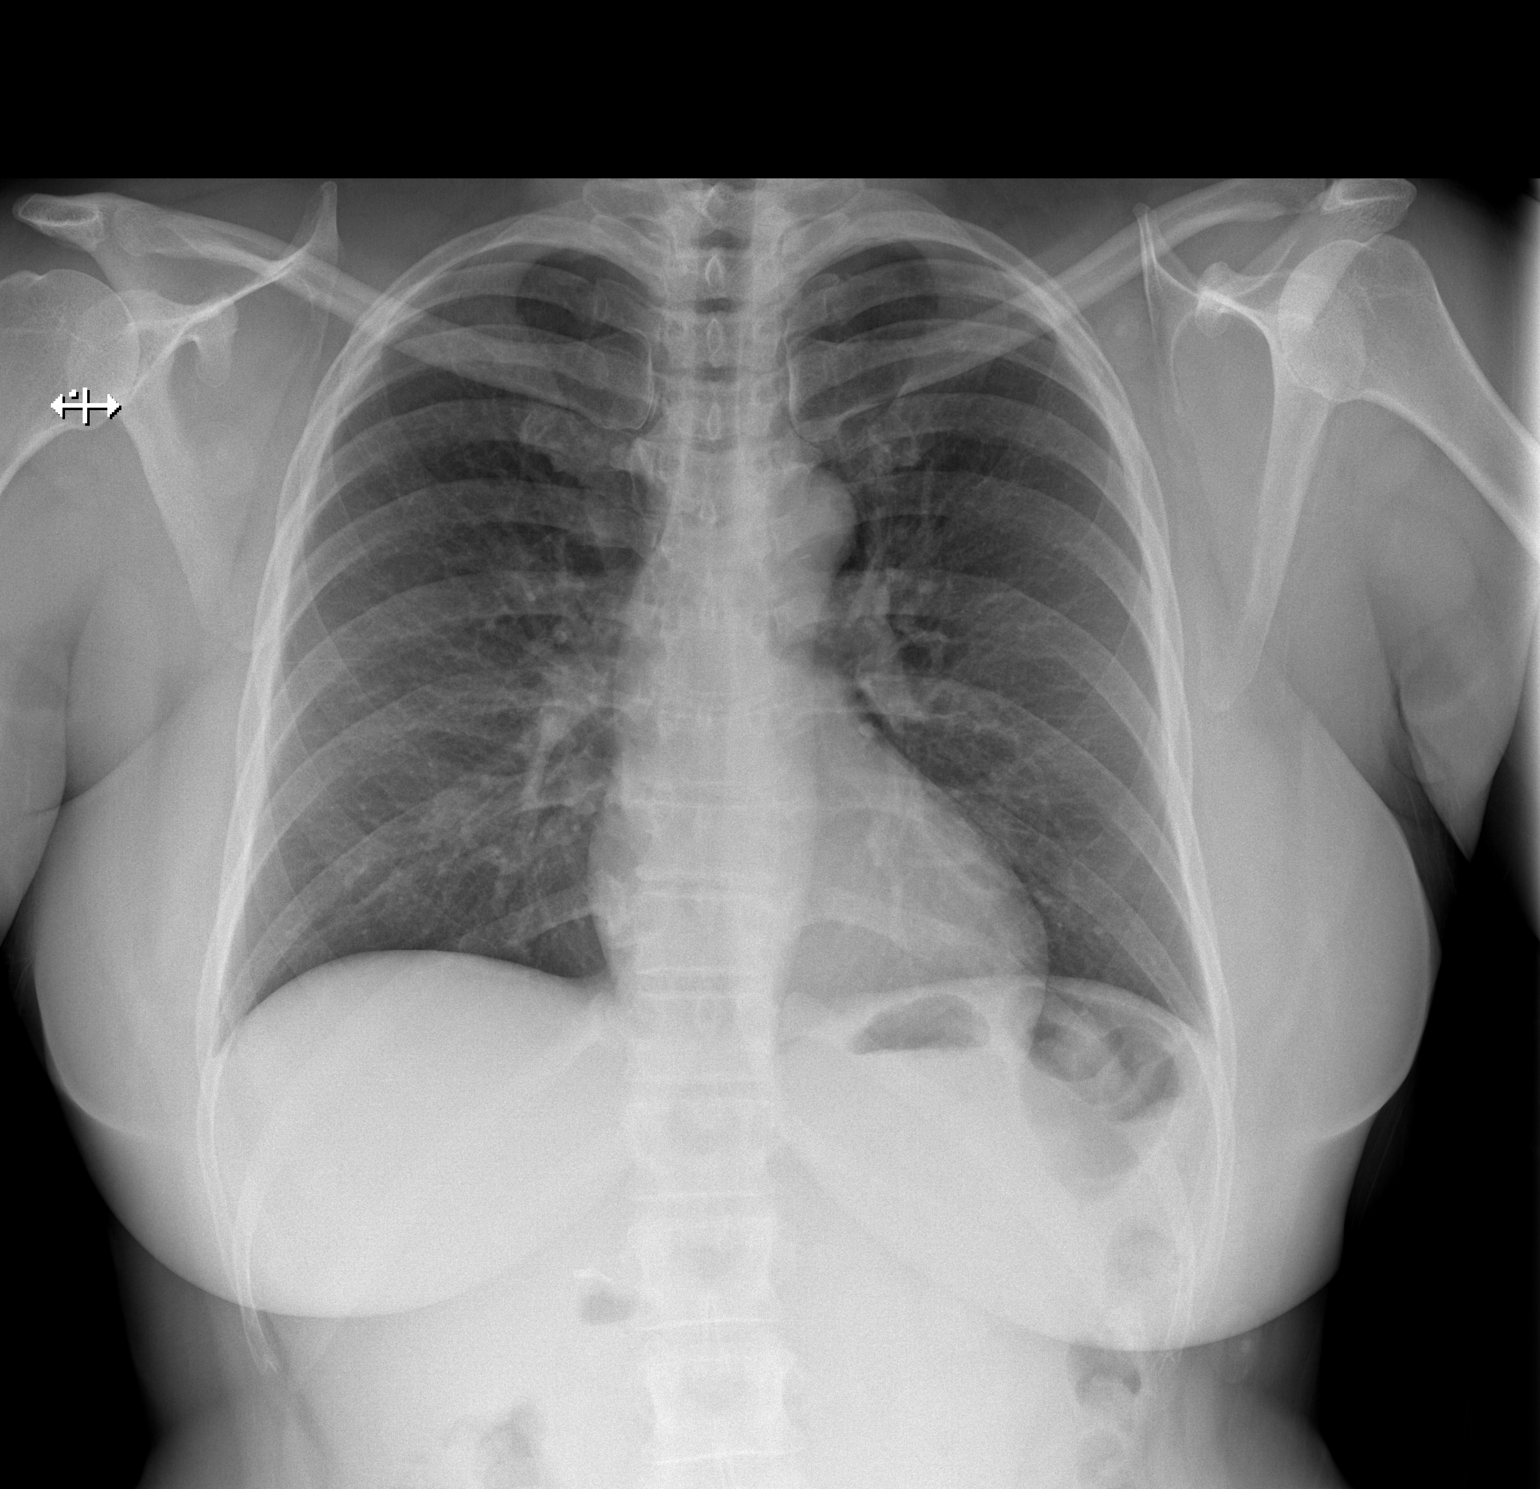

[w chest lat]
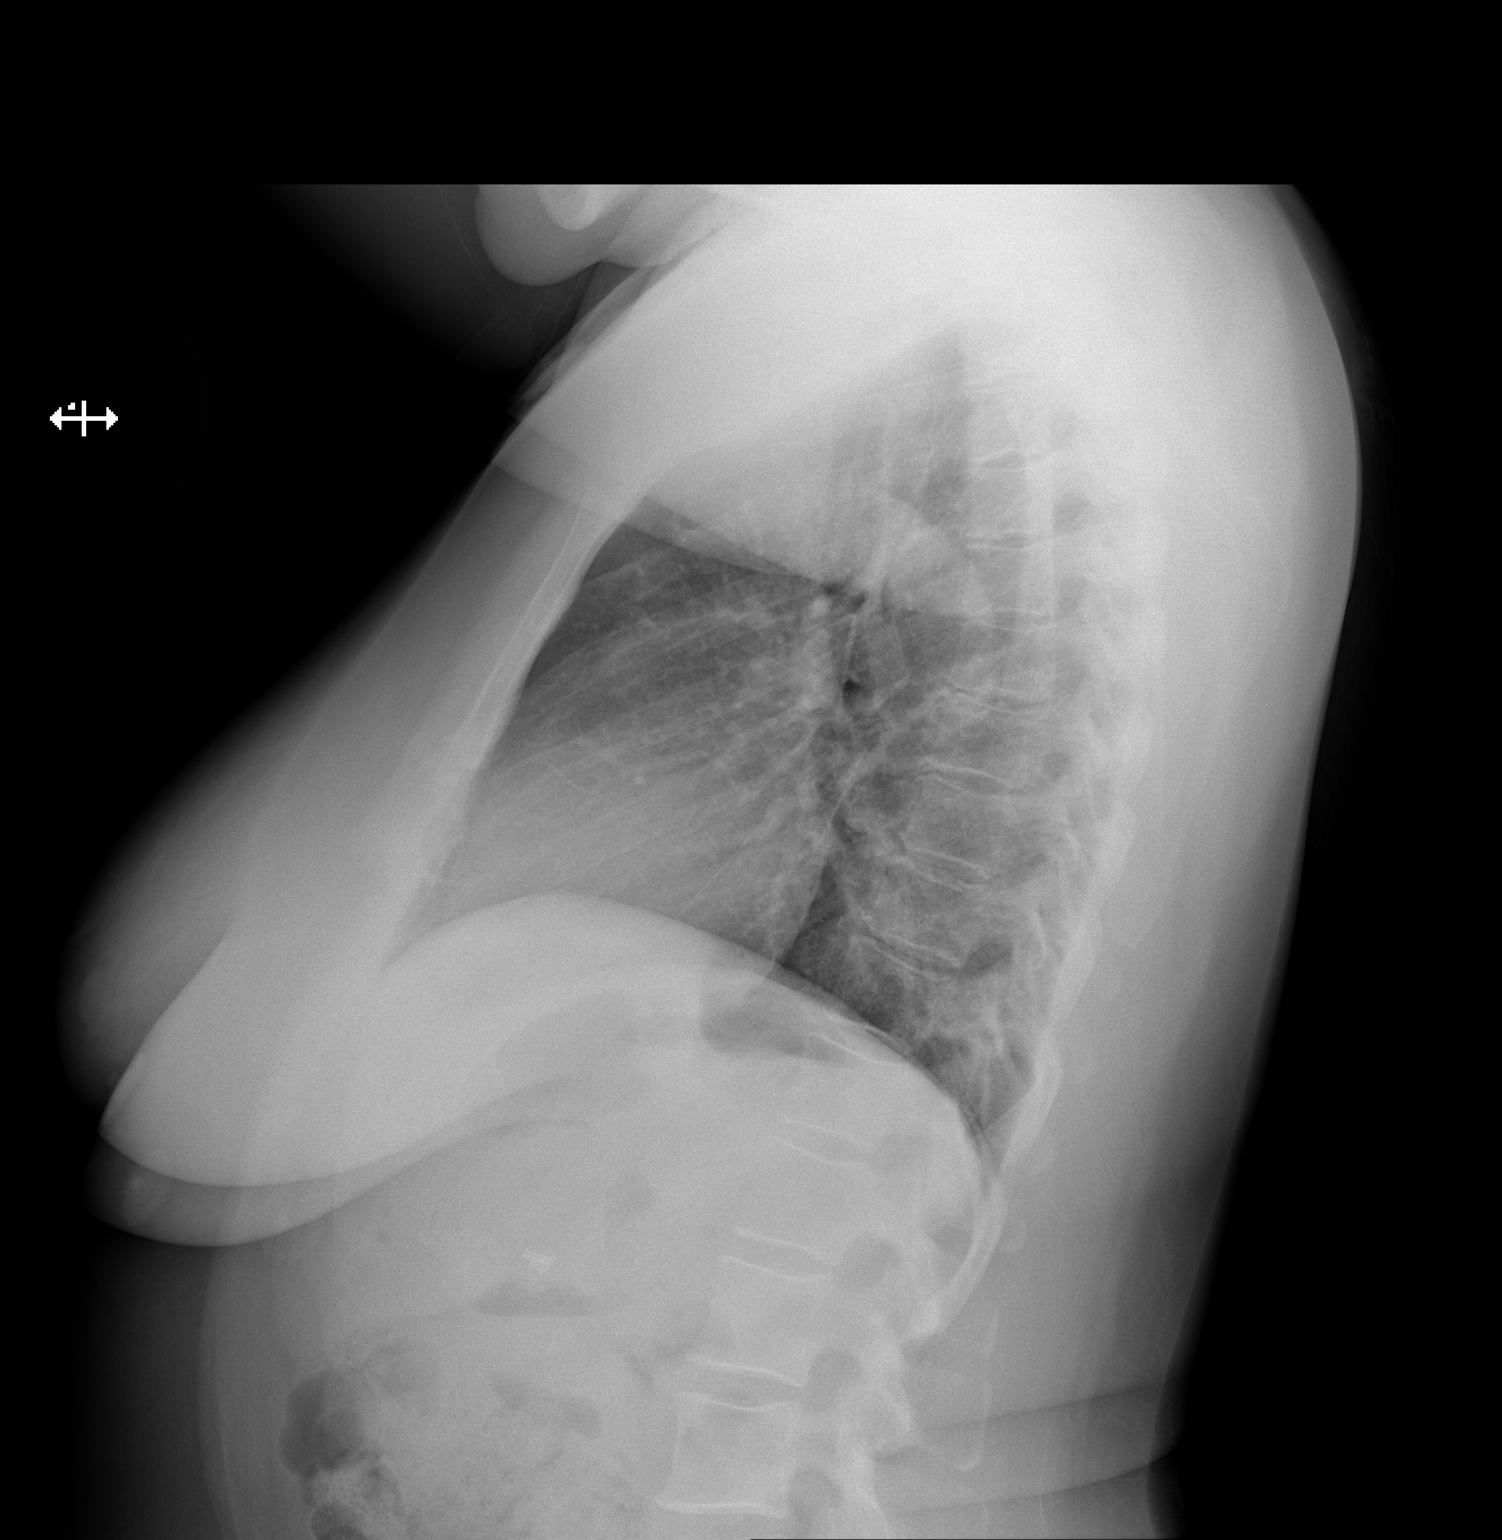

[2 of 2 positions shown; findings below may reference images not displayed]

FINDINGS: The cardiomediastinal contours are normal. The lungs are clear.
Pulmonary vasculature is normal. No consolidation, pleural effusion,
or pneumothorax. No acute osseous abnormalities are seen.
IMPRESSION: Negative radiographs of the chest.

## 2019-09-01 LAB — URINE CULTURE: Culture: 50000 — AB

## 2019-09-02 ENCOUNTER — Telehealth: Payer: Self-pay

## 2019-09-02 NOTE — Telephone Encounter (Signed)
Post ED Visit - Positive Culture Follow-up  Culture report reviewed by antimicrobial stewardship pharmacist: [place] Team []  Elenor Quinones, Pharm.D. []  Heide Guile, Pharm.D., BCPS AQ-ID []  Parks Neptune, Pharm.D., BCPS []  Alycia Rossetti, Pharm.D., BCPS []  [place], Pharm.D., BCPS, AAHIVP []  Legrand Como, Pharm.D., BCPS, AAHIVP []  Salome Arnt, PharmD, BCPS []  Johnnette Gourd, PharmD, BCPS []  Hughes Better, PharmD, BCPS []  Leeroy Cha, PharmD []  Laqueta Linden, PharmD, BCPS []  Albertina Parr, [place] Team []  Leodis Sias, PharmD []  Lindell Spar, PharmD []  Royetta Asal, PharmD []  Graylin Shiver, Rph []  Rema Fendt) Glennon Mac, PharmD []  Arlyn Dunning, PharmD []  Netta Cedars, PharmD []  Dia Sitter, PharmD []  Leone Haven, PharmD []  Gretta Arab, PharmD []  Theodis Shove, PharmD []  Peggyann Juba, PharmD []  Reuel Boom, PharmD   Positive urine culture  and no further patient follow-up is required at this time.  Genia Del 09/02/2019, 9:30 AM

## 2019-09-02 NOTE — Telephone Encounter (Signed)
Previsit [facility] call - no answer - voicemail is full.

## 2019-09-04 ENCOUNTER — Other Ambulatory Visit: Payer: Self-pay

## 2019-09-04 ENCOUNTER — Ambulatory Visit
Admission: RE | Admit: 2019-09-04 | Discharge: 2019-09-04 | Disposition: A | Discharge status: Home / Self Care | Payer: Self-pay | Source: Ambulatory Visit | Attending: Oncology | Admitting: Oncology

## 2019-09-04 ENCOUNTER — Ambulatory Visit: Payer: Self-pay | Attending: Oncology

## 2019-09-04 VITALS — BP 159/109 | HR 78 | Temp 96.2°F | Resp 18 | Wt 187.8 lb

## 2019-09-04 DIAGNOSIS — Z0000 Encounter for general adult medical examination without abnormal findings: Secondary | ICD-10-CM | POA: Insufficient documentation

## 2019-09-04 NOTE — Progress Notes (Signed)
Patient here for [facility]. Pt states she felt a lump on her right breast and didn't know if it was because her menstrual cycle was coming. Denies pain.

## 2019-09-04 NOTE — Progress Notes (Signed)
  Subjective:     Patient ID: Sheila Garner, female   DOB: 01/05/1976, 43 y.o.   MRN: [id number]  HPI   Review of Systems     Objective:   Physical Exam Chest:     Breasts: Breasts are asymmetrical.        Right: No swelling, bleeding, inverted nipple, mass, nipple discharge, skin change or tenderness.        Left: No swelling, bleeding, inverted nipple, mass, nipple discharge, skin change or tenderness.     Comments: Left breast larger than right; cyclical breast tenderness       Assessment:     43 year old patient presents for [place] clinic visit.  Patient screened, and meets [facility] eligibility.  Patient does not have insurance, Medicare or Medicaid. Instructed patient on breast self awareness using teach back method.  Clinical breast exam unremarkable.  No mass or lump palpated.  Patient reports experiencing cyclical breast pain that eases after menstruation.      Plan:     Sent for bilateral screening mammogram.

## 2019-09-13 NOTE — Progress Notes (Signed)
Letter mailed from [facility] to notify of normal mammogram results.  Patient to return in one year for annual screening.  Copy to [facility]. 

## 2019-11-18 ENCOUNTER — Other Ambulatory Visit: Payer: Self-pay

## 2019-11-18 ENCOUNTER — Ambulatory Visit (HOSPITAL_COMMUNITY)
Admission: EM | Admit: 2019-11-18 | Discharge: 2019-11-18 | Disposition: A | Discharge status: Home / Self Care | Payer: Medicaid Other | Attending: Family Medicine | Admitting: Family Medicine

## 2019-11-18 ENCOUNTER — Encounter (HOSPITAL_COMMUNITY): Payer: Self-pay | Admitting: Emergency Medicine

## 2019-11-18 DIAGNOSIS — R202 Paresthesia of skin: Secondary | ICD-10-CM

## 2019-11-18 DIAGNOSIS — R200 Anesthesia of skin: Secondary | ICD-10-CM | POA: Diagnosis not present

## 2019-11-18 LAB — CBG MONITORING, ED: Glucose-Capillary: 89 mg/dL (ref 70–99)

## 2019-11-18 LAB — GLUCOSE, CAPILLARY: Glucose-Capillary: 89 mg/dL (ref 70–99)

## 2019-11-18 MED ORDER — METHYLPREDNISOLONE 4 MG PO TBPK: ORAL_TABLET | ORAL | 0 refills | Status: DC

## 2019-11-18 MED ORDER — TIZANIDINE HCL 4 MG PO TABS: 4.0000 mg | ORAL_TABLET | Freq: Four times a day (QID) | ORAL | 0 refills | Status: DC | PRN

## 2019-11-18 NOTE — ED Triage Notes (Signed)
Pt reports right hand numbness that started on Saturday.  Soon after the numbness she started having a weird feeling in her upper arm and elbow, she states not exactly pain.  She also reports some mild swelling to her right wrist and a knot on her right thumb that she does not know if they are related.  Pt denies any injury to the arm or hand.

## 2019-11-18 NOTE — Discharge Instructions (Addendum)
Take the medrol ( steroid ) as directed Take all of day one today Take the muscle relaxer at bedtime to relax muscles in neck If you do not improve, see a hand specialist

## 2019-11-18 NOTE — ED Provider Notes (Signed)
[facility]    CSN: [id number] Arrival date & time: 11/18/19  0915      History   Chief Complaint Chief Complaint  Patient presents with  . Numbness    RUE    HPI Sheila Garner is a 44 y.o. female.   HPI  Patient has numbness in her right thumb.  Is been present for about a week.  No repetitive motion.  No trauma.  It bothers her at night and also during the day.  She also has a couple bumps on her finger she would like to have checked.  As I look at them I told her that there are some early arthritis.  She has some pain in the back of her arm and points to her tricep region.  She states she had a "trapezius strain" last July or August and she has had pain in her shoulder ever since then.  No specific neck pain, neck injury, or identified radiation from the neck.  Past Medical History:  Diagnosis Date  . Asthma   . Hypertension     Patient Active Problem List   Diagnosis Date Noted  . Sprain of metacarpophalangeal joint of right little finger 10/17/2018  . Closed fracture of tuft of distal phalanx of finger 08/02/2018    Past Surgical History:  Procedure Laterality Date  . CHOLECYSTECTOMY  2012  . KNEE SURGERY Left     OB History   No obstetric history on file.      Home Medications    Prior to Admission medications   Medication Sig Start Date End Date Taking? Authorizing Provider  amLODipine (NORVASC) 5 MG tablet Take 1 tablet (5 mg total) by mouth daily. 06/18/18  Yes Wallis Bamberg, PA-C  lisinopril (PRINIVIL,ZESTRIL) 10 MG tablet Take 1 tablet (10 mg total) by mouth daily. 09/06/17  Yes Elvina Sidle, MD  ibuprofen (ADVIL,MOTRIN) 200 MG tablet Take 200 mg by mouth every 6 (six) hours as needed for pain.    [provider]  magic mouthwash SOLN Take 5 mLs by mouth 3 (three) times daily as needed for mouth pain. 03/16/19   Irean Hong, MD  methylPREDNISolone (MEDROL DOSEPAK) 4 MG TBPK tablet tad 11/18/19   Eustace Moore, MD  ondansetron  (ZOFRAN ODT) 4 MG disintegrating tablet Take 1 tablet (4 mg total) by mouth every 8 (eight) hours as needed for nausea or vomiting. 08/30/19   Robinson, [place] N, PA-C  tiZANidine (ZANAFLEX) 4 MG tablet Take 1-2 tablets (4-8 mg total) by mouth every 6 (six) hours as needed for muscle spasms. 11/18/19   Eustace Moore, MD    Family History Family History  Problem Relation Age of Onset  . Heart failure Mother   . Diabetes Mother   . Hypertension Mother   . Diabetes Father   . Hypertension Father   . Stroke Father   . Breast cancer Maternal Aunt 60  . Breast cancer Maternal Grandmother 85    Social History Social History   Tobacco Use  . Smoking status: Current Every Day Smoker    Packs/day: 0.25    Years: 15.00    Pack years: 3.75    Types: Cigarettes  . Smokeless tobacco: Never Used  Substance Use Topics  . Alcohol use: Yes    Comment: socially  . Drug use: No     Allergies   Red dye and Latex   Review of Systems Review of Systems  Musculoskeletal: Positive for myalgias.  Some muscle tenderness right trapezius  Neurological: Positive for numbness. Negative for tremors and weakness.       Right thumb     Physical Exam Triage Vital Signs ED Triage Vitals  Enc Vitals Group     BP 11/18/19 1007 (!) 141/89     Pulse Rate 11/18/19 1007 78     Resp 11/18/19 1007 18     Temp 11/18/19 1007 98.4 F (36.9 C)     Temp Source 11/18/19 1007 Oral     SpO2 11/18/19 1007 99 %     Weight --      Height --      Head Circumference --      Peak Flow --      Pain Score 11/18/19 1008 0     Pain Loc --      Pain Edu? --      Excl. in [place]? --    No data found.  Updated Vital Signs BP (!) 141/89 (BP Location: Left Arm)   Pulse 78   Temp 98.4 F (36.9 C) (Oral)   Resp 18   LMP 11/17/2019 (Exact Date)   SpO2 99%      Physical Exam Vitals reviewed.  Constitutional:      General: She is not in acute distress.    Appearance: Normal appearance. She is normal  weight.     Comments: Pleasant.  HENT:     Mouth/Throat:     Comments: Mask is in place Neck:     Comments: Mild tenderness right trapezius Cardiovascular:     Rate and Rhythm: Normal rate and regular rhythm.  Pulmonary:     Effort: Pulmonary effort is normal. No respiratory distress.  Musculoskeletal:        General: No swelling or tenderness.     Cervical back: Normal range of motion.     Comments: Phalen's, Tinel's, Finkelstein's are negative.  No tenderness over Guyon's canal.  Sensation appears normal in all fingertips.  Skin:    General: Skin is warm and dry.     Capillary Refill: Capillary refill takes less than 2 seconds.     Findings: No lesion.  Neurological:     General: No focal deficit present.     Mental Status: She is alert.     Sensory: No sensory deficit.     Motor: No weakness.     Coordination: Coordination normal.  Psychiatric:        Mood and Affect: Mood normal.        Behavior: Behavior normal.      UC Treatments / Results  Labs (all labs ordered are listed, but only abnormal results are displayed) Labs Reviewed  GLUCOSE, CAPILLARY  CBG MONITORING, ED    EKG   Radiology No results found.  Procedures Procedures (including critical care time)  Medications Ordered in UC Medications - No data to display  Initial Impression / Assessment and Plan / UC Course  I have reviewed the triage vital signs and the nursing notes.  Pertinent labs & imaging results that were available during my care of the patient were reviewed by me and considered in my medical decision making (see chart for details).    Uncertain cause of thumb numbness.  The most common cause would be carpal tunnel syndrome, however, she has negative Phalen's and Tinel's.  No repetitive use. Will treat with steroids and have her get additional testing if she fails to improve. Final Clinical Impressions(s) / UC Diagnoses  Final diagnoses:  Hand numbness     Discharge  Instructions     Take the medrol ( steroid ) as directed Take all of day one today Take the muscle relaxer at bedtime to relax muscles in neck If you do not improve, see a hand specialist     ED Prescriptions    Medication Sig Dispense Auth. Provider   methylPREDNISolone (MEDROL DOSEPAK) 4 MG TBPK tablet tad 21 tablet Eustace Moore, MD   tiZANidine (ZANAFLEX) 4 MG tablet Take 1-2 tablets (4-8 mg total) by mouth every 6 (six) hours as needed for muscle spasms. 21 tablet Eustace Moore, MD     PDMP not reviewed this encounter.   Eustace Moore, MD 11/18/19 [phone]

## 2020-01-16 ENCOUNTER — Other Ambulatory Visit: Payer: Self-pay

## 2020-01-16 ENCOUNTER — Ambulatory Visit (HOSPITAL_COMMUNITY)
Admission: EM | Admit: 2020-01-16 | Discharge: 2020-01-16 | Disposition: A | Discharge status: Home / Self Care | Payer: Medicaid Other | Attending: Family Medicine | Admitting: Family Medicine

## 2020-01-16 ENCOUNTER — Encounter (HOSPITAL_COMMUNITY): Payer: Self-pay

## 2020-01-16 DIAGNOSIS — Z79899 Other long term (current) drug therapy: Secondary | ICD-10-CM | POA: Insufficient documentation

## 2020-01-16 DIAGNOSIS — J45909 Unspecified asthma, uncomplicated: Secondary | ICD-10-CM | POA: Diagnosis not present

## 2020-01-16 DIAGNOSIS — R197 Diarrhea, unspecified: Secondary | ICD-10-CM

## 2020-01-16 DIAGNOSIS — Z20822 Contact with and (suspected) exposure to covid-19: Secondary | ICD-10-CM | POA: Insufficient documentation

## 2020-01-16 DIAGNOSIS — F17210 Nicotine dependence, cigarettes, uncomplicated: Secondary | ICD-10-CM | POA: Diagnosis not present

## 2020-01-16 DIAGNOSIS — I10 Essential (primary) hypertension: Secondary | ICD-10-CM | POA: Diagnosis not present

## 2020-01-16 DIAGNOSIS — R1033 Periumbilical pain: Secondary | ICD-10-CM | POA: Diagnosis present

## 2020-01-16 DIAGNOSIS — A0839 Other viral enteritis: Secondary | ICD-10-CM | POA: Diagnosis not present

## 2020-01-16 LAB — CBC WITH DIFFERENTIAL/PLATELET
Abs Immature Granulocytes: 0.03 10*3/uL (ref 0.00–0.07)
Basophils Absolute: 0 10*3/uL (ref 0.0–0.1)
Basophils Relative: 1 %
Eosinophils Absolute: 0 10*3/uL (ref 0.0–0.5)
Eosinophils Relative: 1 %
HCT: 37.7 % (ref 36.0–46.0)
Hemoglobin: 12.1 g/dL (ref 12.0–15.0)
Immature Granulocytes: 1 %
Lymphocytes Relative: 43 %
Lymphs Abs: 2.7 10*3/uL (ref 0.7–4.0)
MCH: 24.8 pg — ABNORMAL LOW (ref 26.0–34.0)
MCHC: 32.1 g/dL (ref 30.0–36.0)
MCV: 77.4 fL — ABNORMAL LOW (ref 80.0–100.0)
Monocytes Absolute: 0.4 10*3/uL (ref 0.1–1.0)
Monocytes Relative: 7 %
Neutro Abs: 3.1 10*3/uL (ref 1.7–7.7)
Neutrophils Relative %: 47 %
Platelets: 452 10*3/uL — ABNORMAL HIGH (ref 150–400)
RBC: 4.87 MIL/uL (ref 3.87–5.11)
RDW: 16.2 % — ABNORMAL HIGH (ref 11.5–15.5)
WBC: 6.3 10*3/uL (ref 4.0–10.5)
nRBC: 0 % (ref 0.0–0.2)

## 2020-01-16 LAB — COMPREHENSIVE METABOLIC PANEL
ALT: 20 U/L (ref 0–44)
AST: 21 U/L (ref 15–41)
Albumin: 4 g/dL (ref 3.5–5.0)
Alkaline Phosphatase: 87 U/L (ref 38–126)
Anion gap: 10 (ref 5–15)
BUN: 8 mg/dL (ref 6–20)
CO2: 19 mmol/L — ABNORMAL LOW (ref 22–32)
Calcium: 9.4 mg/dL (ref 8.9–10.3)
Chloride: 110 mmol/L (ref 98–111)
Creatinine, Ser: 0.72 mg/dL (ref 0.44–1.00)
GFR calc Af Amer: >60 mL/min (ref >60)
GFR calc non Af Amer: >60 mL/min (ref >60)
Glucose, Bld: 92 mg/dL (ref 70–99)
Potassium: 3.6 mmol/L (ref 3.5–5.1)
Sodium: 139 mmol/L (ref 135–145)
Total Bilirubin: 0.4 mg/dL (ref 0.3–1.2)
Total Protein: 6.9 g/dL (ref 6.5–8.1)

## 2020-01-16 LAB — SARS CORONAVIRUS 2 (TAT 6-24 HRS): SARS Coronavirus 2: NEGATIVE

## 2020-01-16 MED ORDER — ONDANSETRON 4 MG PO TBDP: 4.0000 mg | ORAL_TABLET | Freq: Three times a day (TID) | ORAL | 0 refills | Status: AC | PRN

## 2020-01-16 MED ORDER — LOPERAMIDE HCL 2 MG PO CAPS: 2.0000 mg | ORAL_CAPSULE | Freq: Four times a day (QID) | ORAL | 0 refills | Status: DC | PRN

## 2020-01-16 NOTE — ED Triage Notes (Signed)
Pt is here with abdominal pain, NVD that started 3 days ago. Pt states she does not have a gallbladder so she is use to some of these symptoms, pt has not taken anything to relieve discomfort.

## 2020-01-16 NOTE — Discharge Instructions (Signed)
We are testing your stool for bacteria.  We will call you with any positive results. You can do Zofran as needed for nausea, vomiting and Imodium to help with the diarrhea. She is drinking plenty of fluids to stay hydrated to include Gatorade for electrolytes most important potassium. If your symptoms worsen or you start having severe abdominal pain or vomiting you need to go to the ER.

## 2020-01-17 LAB — GASTROINTESTINAL PANEL BY PCR, STOOL (REPLACES STOOL CULTURE)

## 2020-01-19 NOTE — ED Provider Notes (Signed)
[facility]    CSN: [id number] Arrival date & time: 01/16/20  1021      History   Chief Complaint Chief Complaint  Patient presents with  . Vomiting  . Diarrhea  . Abdominal Pain    HPI Sheila Garner is a 44 y.o. female.   Patient is a 44 year old female presents today with nausea, vomiting and diarrhea that started 3 days ago.  Symptoms have been constant.  She has had 5-7 episodes of diarrhea daily.  Reports a similar solid but mostly watery.  No blood in stool.  Her vomiting has subsided.  She has been sipping fluids to stay hydrated.  She has had loss of appetite.  The abdominal pain is mostly cramping and located periumbilical.  The pain typically comes before she has an episode of diarrhea and somewhat resolved.  No fevers, chills, body aches or night sweats.  No recent sick contacts or recent traveling.  ROS per HPI      Past Medical History:  Diagnosis Date  . Asthma   . Hypertension     Patient Active Problem List   Diagnosis Date Noted  . Sprain of metacarpophalangeal joint of right little finger 10/17/2018  . Closed fracture of tuft of distal phalanx of finger 08/02/2018    Past Surgical History:  Procedure Laterality Date  . CHOLECYSTECTOMY  2012  . KNEE SURGERY Left     OB History   No obstetric history on file.      Home Medications    Prior to Admission medications   Medication Sig Start Date End Date Taking? Authorizing Provider  sucralfate (CARAFATE) 1 g tablet Take by mouth. 03/19/19  Yes [provider]  topiramate (TOPAMAX) 50 MG tablet Take 50 mg by mouth daily.   Yes [provider]  amLODipine (NORVASC) 5 MG tablet Take 1 tablet (5 mg total) by mouth daily. 06/18/18   Wallis Bamberg, PA-C  ibuprofen (ADVIL,MOTRIN) 200 MG tablet Take 200 mg by mouth every 6 (six) hours as needed for pain.    [provider]  lisinopril (PRINIVIL,ZESTRIL) 10 MG tablet Take 1 tablet (10 mg total) by mouth daily. 09/06/17    Elvina Sidle, MD  loperamide (IMODIUM) 2 MG capsule Take 1 capsule (2 mg total) by mouth 4 (four) times daily as needed for diarrhea or loose stools. 01/16/20   Janace Aris, NP  magic mouthwash SOLN Take 5 mLs by mouth 3 (three) times daily as needed for mouth pain. 03/16/19   Irean Hong, MD  methylPREDNISolone (MEDROL DOSEPAK) 4 MG TBPK tablet tad 11/18/19   Eustace Moore, MD  ondansetron (ZOFRAN ODT) 4 MG disintegrating tablet Take 1 tablet (4 mg total) by mouth every 8 (eight) hours as needed for nausea or vomiting. 01/16/20   Dahlia Byes A, NP  tiZANidine (ZANAFLEX) 4 MG tablet Take 1-2 tablets (4-8 mg total) by mouth every 6 (six) hours as needed for muscle spasms. 11/18/19   Eustace Moore, MD    Family History Family History  Problem Relation Age of Onset  . Heart failure Mother   . Diabetes Mother   . Hypertension Mother   . Diabetes Father   . Hypertension Father   . Stroke Father   . Breast cancer Maternal Aunt 60  . Breast cancer Maternal Grandmother 35    Social History Social History   Tobacco Use  . Smoking status: Current Every Day Smoker    Packs/day: 0.25    Years:  15.00    Pack years: 3.75    Types: Cigarettes  . Smokeless tobacco: Never Used  Substance Use Topics  . Alcohol use: Yes    Comment: socially  . Drug use: No     Allergies   Red dye and Latex   Review of Systems Review of Systems   Physical Exam Triage Vital Signs ED Triage Vitals  Enc Vitals Group     BP 01/16/20 1033 (!) 134/99     Pulse Rate 01/16/20 1033 91     Resp 01/16/20 1033 18     Temp 01/16/20 1033 98.2 F (36.8 C)     Temp Source 01/16/20 1033 Oral     SpO2 01/16/20 1033 100 %     Weight 01/16/20 1039 182 lb 12.8 oz (82.9 kg)     Height --      Head Circumference --      Peak Flow --      Pain Score 01/16/20 1039 5     Pain Loc --      Pain Edu? --      Excl. in [place]? --    No data found.  Updated Vital Signs BP (!) 134/99 (BP Location: Left Arm)    Pulse 91   Temp 98.2 F (36.8 C) (Oral)   Resp 18   Wt 182 lb 12.8 oz (82.9 kg)   LMP 01/08/2020   SpO2 100%   BMI 31.38 kg/m   Visual Acuity Right Eye Distance:   Left Eye Distance:   Bilateral Distance:    Right Eye Near:   Left Eye Near:    Bilateral Near:     Physical Exam Vitals and nursing note reviewed.  Constitutional:      General: She is not in acute distress.    Appearance: Normal appearance. She is not ill-appearing, toxic-appearing or diaphoretic.  HENT:     Head: Normocephalic.     Nose: Nose normal.     Mouth/Throat:     Pharynx: Oropharynx is clear.  Eyes:     Conjunctiva/sclera: Conjunctivae normal.  Pulmonary:     Effort: Pulmonary effort is normal.  Abdominal:     General: Abdomen is flat. Bowel sounds are normal.     Palpations: Abdomen is soft.     Tenderness: There is generalized abdominal tenderness. There is no guarding or rebound.  Musculoskeletal:        General: Normal range of motion.     Cervical back: Normal range of motion.  Skin:    General: Skin is warm and dry.     Findings: No rash.  Neurological:     Mental Status: She is alert.  Psychiatric:        Mood and Affect: Mood normal.      UC Treatments / Results  Labs (all labs ordered are listed, but only abnormal results are displayed) Labs Reviewed  GASTROINTESTINAL PANEL BY PCR, STOOL (REPLACES STOOL CULTURE) - Abnormal; Notable for the following components:      Result Value   Norovirus GI/GII DETECTED (*)    All other components within normal limits  CBC WITH DIFFERENTIAL/PLATELET - Abnormal; Notable for the following components:   MCV 77.4 (*)    MCH 24.8 (*)    RDW 16.2 (*)    Platelets 452 (*)    All other components within normal limits  COMPREHENSIVE METABOLIC PANEL - Abnormal; Notable for the following components:   CO2 19 (*)    All other components  within normal limits  SARS CORONAVIRUS 2 (TAT 6-24 HRS)    EKG   Radiology No results  found.  Procedures Procedures (including critical care time)  Medications Ordered in UC Medications - No data to display  Initial Impression / Assessment and Plan / UC Course  I have reviewed the triage vital signs and the nursing notes.  Pertinent labs & imaging results that were available during my care of the patient were reviewed by me and considered in my medical decision making (see chart for details).     Diarrhea of presumed infectious origin Obtaining stool sample for testing Recommended keep drinking fluids with electrolytes to stay hydrated. Zofran as needed for nausea, vomiting Imodium for diarrhea Check in a few labs to make sure she is not dehydrated Return precautions and ER precautions given.  Final Clinical Impressions(s) / UC Diagnoses   Final diagnoses:  Diarrhea of presumed infectious origin  Periumbilical abdominal pain     Discharge Instructions     We are testing your stool for bacteria.  We will call you with any positive results. You can do Zofran as needed for nausea, vomiting and Imodium to help with the diarrhea. She is drinking plenty of fluids to stay hydrated to include Gatorade for electrolytes most important potassium. If your symptoms worsen or you start having severe abdominal pain or vomiting you need to go to the ER.    ED Prescriptions    Medication Sig Dispense Auth. Provider   ondansetron (ZOFRAN ODT) 4 MG disintegrating tablet Take 1 tablet (4 mg total) by mouth every 8 (eight) hours as needed for nausea or vomiting. 20 tablet Breean Nannini A, NP   loperamide (IMODIUM) 2 MG capsule Take 1 capsule (2 mg total) by mouth 4 (four) times daily as needed for diarrhea or loose stools. 12 capsule Kasyn Rolph A, NP     PDMP not reviewed this encounter.   Orvan July, NP 01/19/20 [id number]

## 2020-02-24 ENCOUNTER — Other Ambulatory Visit: Payer: Self-pay

## 2020-02-26 ENCOUNTER — Emergency Department: Payer: Medicaid Other

## 2020-02-26 ENCOUNTER — Other Ambulatory Visit: Payer: Self-pay

## 2020-02-26 ENCOUNTER — Emergency Department
Admission: EM | Admit: 2020-02-26 | Discharge: 2020-02-26 | Disposition: A | Discharge status: Home / Self Care | Payer: Medicaid Other | Attending: Emergency Medicine | Admitting: Emergency Medicine

## 2020-02-26 DIAGNOSIS — J45909 Unspecified asthma, uncomplicated: Secondary | ICD-10-CM | POA: Diagnosis not present

## 2020-02-26 DIAGNOSIS — F17210 Nicotine dependence, cigarettes, uncomplicated: Secondary | ICD-10-CM | POA: Insufficient documentation

## 2020-02-26 DIAGNOSIS — Z79899 Other long term (current) drug therapy: Secondary | ICD-10-CM | POA: Insufficient documentation

## 2020-02-26 DIAGNOSIS — Z91040 Latex allergy status: Secondary | ICD-10-CM | POA: Insufficient documentation

## 2020-02-26 DIAGNOSIS — R110 Nausea: Secondary | ICD-10-CM | POA: Insufficient documentation

## 2020-02-26 DIAGNOSIS — R1032 Left lower quadrant pain: Secondary | ICD-10-CM

## 2020-02-26 DIAGNOSIS — I10 Essential (primary) hypertension: Secondary | ICD-10-CM | POA: Insufficient documentation

## 2020-02-26 LAB — COMPREHENSIVE METABOLIC PANEL
ALT: 30 U/L (ref 0–44)
AST: 27 U/L (ref 15–41)
Albumin: 4.1 g/dL (ref 3.5–5.0)
Alkaline Phosphatase: 102 U/L (ref 38–126)
Anion gap: 8 (ref 5–15)
BUN: 14 mg/dL (ref 6–20)
CO2: 23 mmol/L (ref 22–32)
Calcium: 9.7 mg/dL (ref 8.9–10.3)
Chloride: 103 mmol/L (ref 98–111)
Creatinine, Ser: 0.74 mg/dL (ref 0.44–1.00)
GFR calc Af Amer: >60 mL/min (ref >60)
GFR calc non Af Amer: >60 mL/min (ref >60)
Glucose, Bld: 100 mg/dL — ABNORMAL HIGH (ref 70–99)
Potassium: 3.8 mmol/L (ref 3.5–5.1)
Sodium: 134 mmol/L — ABNORMAL LOW (ref 135–145)
Total Bilirubin: 0.4 mg/dL (ref 0.3–1.2)
Total Protein: 7.7 g/dL (ref 6.5–8.1)

## 2020-02-26 LAB — URINALYSIS, COMPLETE (UACMP) WITH MICROSCOPIC
Bacteria, UA: NONE SEEN
Bilirubin Urine: NEGATIVE
Glucose, UA: NEGATIVE mg/dL
Ketones, ur: 5 mg/dL — AB
Leukocytes,Ua: NEGATIVE
Nitrite: NEGATIVE
Protein, ur: NEGATIVE mg/dL
Specific Gravity, Urine: 1.027 (ref 1.005–1.030)
pH: 5 (ref 5.0–8.0)

## 2020-02-26 LAB — CBC
HCT: 39.6 % (ref 36.0–46.0)
Hemoglobin: 12.7 g/dL (ref 12.0–15.0)
MCH: 24.6 pg — ABNORMAL LOW (ref 26.0–34.0)
MCHC: 32.1 g/dL (ref 30.0–36.0)
MCV: 76.6 fL — ABNORMAL LOW (ref 80.0–100.0)
Platelets: 490 10*3/uL — ABNORMAL HIGH (ref 150–400)
RBC: 5.17 MIL/uL — ABNORMAL HIGH (ref 3.87–5.11)
RDW: 15.4 % (ref 11.5–15.5)
WBC: 6.3 10*3/uL (ref 4.0–10.5)
nRBC: 0 % (ref 0.0–0.2)

## 2020-02-26 LAB — POCT PREGNANCY, URINE: Preg Test, Ur: NEGATIVE

## 2020-02-26 LAB — LIPASE, BLOOD: Lipase: 31 U/L (ref 11–51)

## 2020-02-26 MED ORDER — SODIUM CHLORIDE 0.9% FLUSH: 3.0000 mL | Freq: Once | INTRAVENOUS | Status: DC

## 2020-02-26 MED ORDER — IOHEXOL 300 MG/ML  SOLN
100.0000 mL | Freq: Once | INTRAMUSCULAR | Status: AC | PRN
  Administered 2020-02-26: 100 mL via INTRAVENOUS
  Filled 2020-02-26: qty 100

## 2020-02-26 NOTE — ED Provider Notes (Signed)
[facility] Emergency Department Provider Note   ____________________________________________   First MD Initiated Contact with Patient 02/26/20 1437     (approximate)  I have reviewed the triage vital signs and the nursing notes.   HISTORY  Chief Complaint Abdominal Pain    HPI Sheila Garner is a 44 y.o. female with possible history of hypertension, asthma, and cholecystectomy who presents to the ED complaining of abdominal pain.  Patient reports that she has had intermittent pain in the left lower quadrant of her abdomen for about the past 2 weeks.  She had been following with her PCP for this problem and pain seem to have resolved at 1 point, but returned a few days ago.  Her PCP prescribed Amitiza which has caused her to have more frequent bowel movements but pain has continued to worsen.  It is associated with nausea but she denies any vomiting.  She briefly noticed small streaks of blood in her stool, but this is also resolved.  She denies any history of similar symptoms, has not had any dysuria, hematuria, vaginal bleeding, or vaginal discharge.        Past Medical History:  Diagnosis Date  . Asthma   . Hypertension     Patient Active Problem List   Diagnosis Date Noted  . Sprain of metacarpophalangeal joint of right little finger 10/17/2018  . Closed fracture of tuft of distal phalanx of finger 08/02/2018    Past Surgical History:  Procedure Laterality Date  . CHOLECYSTECTOMY  2012  . KNEE SURGERY Left     Prior to Admission medications   Medication Sig Start Date End Date Taking? Authorizing Provider  amLODipine (NORVASC) 5 MG tablet Take 1 tablet (5 mg total) by mouth daily. 06/18/18   Jaynee Eagles, PA-C  ibuprofen (ADVIL,MOTRIN) 200 MG tablet Take 200 mg by mouth every 6 (six) hours as needed for pain.    [provider]  lisinopril (PRINIVIL,ZESTRIL) 10 MG tablet Take 1 tablet (10 mg total) by mouth daily. 09/06/17    Robyn Haber, MD  loperamide (IMODIUM) 2 MG capsule Take 1 capsule (2 mg total) by mouth 4 (four) times daily as needed for diarrhea or loose stools. 01/16/20   Orvan July, NP  magic mouthwash SOLN Take 5 mLs by mouth 3 (three) times daily as needed for mouth pain. 03/16/19   Paulette Blanch, MD  methylPREDNISolone (MEDROL DOSEPAK) 4 MG TBPK tablet tad 11/18/19   Raylene Everts, MD  ondansetron (ZOFRAN ODT) 4 MG disintegrating tablet Take 1 tablet (4 mg total) by mouth every 8 (eight) hours as needed for nausea or vomiting. 01/16/20   Loura Halt A, NP  sucralfate (CARAFATE) 1 g tablet Take by mouth. 03/19/19   [provider]  tiZANidine (ZANAFLEX) 4 MG tablet Take 1-2 tablets (4-8 mg total) by mouth every 6 (six) hours as needed for muscle spasms. 11/18/19   Raylene Everts, MD  topiramate (TOPAMAX) 50 MG tablet Take 50 mg by mouth daily.    [provider]    Allergies Red dye and Latex  Family History  Problem Relation Age of Onset  . Heart failure Mother   . Diabetes Mother   . Hypertension Mother   . Diabetes Father   . Hypertension Father   . Stroke Father   . Breast cancer Maternal Aunt 60  . Breast cancer Maternal Grandmother 62    Social History Social History   Tobacco Use  . Smoking status:  Current Every Day Smoker    Packs/day: 0.25    Years: 15.00    Pack years: 3.75    Types: Cigarettes  . Smokeless tobacco: Never Used  Substance Use Topics  . Alcohol use: Yes    Comment: socially  . Drug use: No    Review of Systems  Constitutional: No fever/chills Eyes: No visual changes. ENT: No sore throat. Cardiovascular: Denies chest pain. Respiratory: Denies shortness of breath. Gastrointestinal: Positive for abdominal pain.  Positive for nausea, no vomiting.  No diarrhea.  No constipation. Genitourinary: Negative for dysuria. Musculoskeletal: Negative for back pain. Skin: Negative for rash. Neurological: Negative for headaches, focal weakness  or numbness.  ____________________________________________   PHYSICAL EXAM:  VITAL SIGNS: ED Triage Vitals  Enc Vitals Group     BP 02/26/20 1125 130/88     Pulse Rate 02/26/20 1125 80     Resp 02/26/20 1125 18     Temp 02/26/20 1125 97.9 F (36.6 C)     Temp Source 02/26/20 1125 Oral     SpO2 02/26/20 1125 100 %     Weight 02/26/20 1127 180 lb (81.6 kg)     Height 02/26/20 1127 5\' 4"  (1.626 m)     Head Circumference --      Peak Flow --      Pain Score 02/26/20 1147 6     Pain Loc --      Pain Edu? --      Excl. in [place]? --     Constitutional: Alert and oriented. Eyes: Conjunctivae are normal. Head: Atraumatic. Nose: No congestion/rhinnorhea. Mouth/Throat: Mucous membranes are moist. Neck: Normal ROM Cardiovascular: Normal rate, regular rhythm. Grossly normal heart sounds. Respiratory: Normal respiratory effort.  No retractions. Lungs CTAB. Gastrointestinal: Soft and tender to palpation in the left lower quadrant with no rebound or guarding. No distention. Genitourinary: deferred Musculoskeletal: No lower extremity tenderness nor edema. Neurologic:  Normal speech and language. No gross focal neurologic deficits are appreciated. Skin:  Skin is warm, dry and intact. No rash noted. Psychiatric: Mood and affect are normal. Speech and behavior are normal.  ____________________________________________   LABS (all labs ordered are listed, but only abnormal results are displayed)  Labs Reviewed  COMPREHENSIVE METABOLIC PANEL - Abnormal; Notable for the following components:      Result Value   Sodium 134 (*)    Glucose, Bld 100 (*)    All other components within normal limits  CBC - Abnormal; Notable for the following components:   RBC 5.17 (*)    MCV 76.6 (*)    MCH 24.6 (*)    Platelets 490 (*)    All other components within normal limits  URINALYSIS, COMPLETE (UACMP) WITH MICROSCOPIC - Abnormal; Notable for the following components:   Color, Urine YELLOW (*)     APPearance HAZY (*)    Hgb urine dipstick MODERATE (*)    Ketones, ur 5 (*)    All other components within normal limits  LIPASE, BLOOD  POC URINE PREG, ED  POCT PREGNANCY, URINE    PROCEDURES  Procedure(s) performed (including Critical Care):  Procedures   ____________________________________________   INITIAL IMPRESSION / ASSESSMENT AND PLAN / ED COURSE       44 year old female presents to the ED complaining of gradually worsening pain in the left lower quadrant of her abdomen, seem to resolve 1 week ago but has returned over the past couple of days.  She does have focal tenderness in the left lower quadrant  and symptoms seem most consistent with diverticulitis.  Given she has no history of this, we will further assess with CT scan, patient declines any medications at this time.  Lab work thus far is unremarkable, pregnancy testing negative and no signs of infection on UA.  CT scan is negative for acute process, no evidence of diverticulitis and there is no ovarian cyst or other pelvic findings.  Pain is well controlled at this time and patient is appropriate for discharge home with PCP follow-up.  She was counseled to return to the ED for new worsening symptoms, patient agrees with plan.      ____________________________________________   FINAL CLINICAL IMPRESSION(S) / ED DIAGNOSES  Final diagnoses:  LLQ abdominal pain     ED Discharge Orders    None       Note:  This document was prepared using Dragon voice recognition software and may include unintentional dictation errors.   Chesley Noon, MD 02/26/20 1600

## 2020-02-26 NOTE — ED Triage Notes (Signed)
Pt arrives via POV for c/o LLQ pain x 2-3 days. Pt states pain is worse when up and moving. PT has a hx of "intestines twisting". Reports nausea. Has not noticed a change in pattern of BMs. Pt in NAD at this time.

## 2020-02-27 ENCOUNTER — Encounter (HOSPITAL_COMMUNITY): Payer: Self-pay

## 2020-02-27 ENCOUNTER — Ambulatory Visit (HOSPITAL_COMMUNITY)
Admission: EM | Admit: 2020-02-27 | Discharge: 2020-02-27 | Disposition: A | Discharge status: Home / Self Care | Payer: Medicaid Other | Attending: Family Medicine | Admitting: Family Medicine

## 2020-02-27 ENCOUNTER — Other Ambulatory Visit: Payer: Self-pay

## 2020-02-27 DIAGNOSIS — T7840XA Allergy, unspecified, initial encounter: Secondary | ICD-10-CM

## 2020-02-27 MED ORDER — METHYLPREDNISOLONE 4 MG PO TBPK
ORAL_TABLET | ORAL | 0 refills | Status: AC
Start: 2020-02-27 — End: ?

## 2020-02-27 MED ORDER — METHYLPREDNISOLONE SODIUM SUCC 125 MG IJ SOLR
INTRAMUSCULAR | Status: AC
  Filled 2020-02-27: qty 2

## 2020-02-27 MED ORDER — METHYLPREDNISOLONE SODIUM SUCC 125 MG IJ SOLR
80.0000 mg | Freq: Once | INTRAMUSCULAR | Status: AC
  Administered 2020-02-27: 80 mg via INTRAMUSCULAR

## 2020-02-27 NOTE — ED Provider Notes (Signed)
[facility]    CSN: [id number] Arrival date & time: 02/27/20  1716      History   Chief Complaint Chief Complaint  Patient presents with  . Rash    HPI Sheila Garner is a 44 y.o. female.   HPI   Patient was seen in the emergency room yesterday for left lower quadrant abdominal pain.  She had a CT scanning of her abdomen performed.  She had contrast dye.  She had no difficulty at the time and was discharged from the ED.  Today she is developed a rash.  She states that her skin feels "scalded".  She has redness to her chest and back, arms.  None on her legs.  She states it itches and burns.  Her face has some soft tissue swelling mostly visible around the eyes.  No difficulty speaking, or swallowing.  No shortness of breath or wheezing. She has some shortness of breath at baseline from cigarette smoking that she states is unchanged She has mild nausea but no vomiting  Past Medical History:  Diagnosis Date  . Asthma   . Hypertension     Patient Active Problem List   Diagnosis Date Noted  . Sprain of metacarpophalangeal joint of right little finger 10/17/2018  . Closed fracture of tuft of distal phalanx of finger 08/02/2018    Past Surgical History:  Procedure Laterality Date  . CHOLECYSTECTOMY  2012  . KNEE SURGERY Left     OB History   No obstetric history on file.      Home Medications    Prior to Admission medications   Medication Sig Start Date End Date Taking? Authorizing Provider  AMITIZA 24 MCG capsule Take 24 mcg by mouth 2 (two) times daily. 02/24/20   [provider]  amLODipine (NORVASC) 5 MG tablet Take 1 tablet (5 mg total) by mouth daily. 06/18/18   Wallis Bamberg, PA-C  FLUoxetine (PROZAC) 40 MG capsule Take 40 mg by mouth daily. 02/10/20   [provider]  ibuprofen (ADVIL,MOTRIN) 200 MG tablet Take 200 mg by mouth every 6 (six) hours as needed for pain.    [provider]  lisinopril-hydrochlorothiazide (ZESTORETIC)  10-12.5 MG tablet Take 1 tablet by mouth daily. 02/10/20   [provider]  methylPREDNISolone (MEDROL DOSEPAK) 4 MG TBPK tablet tad 02/27/20   Eustace Moore, MD  ondansetron (ZOFRAN ODT) 4 MG disintegrating tablet Take 1 tablet (4 mg total) by mouth every 8 (eight) hours as needed for nausea or vomiting. 01/16/20   Dahlia Byes A, NP  sucralfate (CARAFATE) 1 g tablet Take by mouth. 03/19/19   [provider]  topiramate (TOPAMAX) 50 MG tablet Take 50 mg by mouth daily.    [provider]  lisinopril (PRINIVIL,ZESTRIL) 10 MG tablet Take 1 tablet (10 mg total) by mouth daily. 09/06/17 02/27/20  Elvina Sidle, MD    Family History Family History  Problem Relation Age of Onset  . Heart failure Mother   . Diabetes Mother   . Hypertension Mother   . Diabetes Father   . Hypertension Father   . Stroke Father   . Breast cancer Maternal Aunt 60  . Breast cancer Maternal Grandmother 40    Social History Social History   Tobacco Use  . Smoking status: Current Every Day Smoker    Packs/day: 0.25    Years: 15.00    Pack years: 3.75    Types: Cigarettes  . Smokeless tobacco: Never Used  Substance Use  Topics  . Alcohol use: Yes    Comment: socially  . Drug use: No     Allergies   Red dye and Latex   Review of Systems Review of Systems  Skin: Positive for color change and rash.     Physical Exam Triage Vital Signs ED Triage Vitals  Enc Vitals Group     BP 02/27/20 1736 107/80     Pulse Rate 02/27/20 1736 (!) 114     Resp 02/27/20 1736 18     Temp 02/27/20 1736 98 F (36.7 C)     Temp Source 02/27/20 1736 Oral     SpO2 02/27/20 1736 100 %     Weight 02/27/20 1733 180 lb (81.6 kg)     Height --      Head Circumference --      Peak Flow --      Pain Score 02/27/20 1733 0     Pain Loc --      Pain Edu? --      Excl. in GC? --    No data found.  Updated Vital Signs BP 107/80 (BP Location: Right Arm)   Pulse (!) 114   Temp 98 F (36.7 C)  (Oral)   Resp 18   Wt 81.6 kg   LMP 02/26/2020   SpO2 100%   BMI 30.90 kg/m       Physical Exam Constitutional:      General: She is not in acute distress.    Appearance: She is well-developed.     Comments: Patient is overweight.  Mildly distressed.  Skin of face is slightly puffy.  Skin is flushed with no papular patches, just faintly red and warm to touch over face and anterior chest, upper back.  HENT:     Head: Normocephalic and atraumatic.     Nose: Nose normal. No congestion.     Mouth/Throat:     Mouth: Mucous membranes are moist.     Pharynx: No posterior oropharyngeal erythema.  Eyes:     Conjunctiva/sclera: Conjunctivae normal.     Pupils: Pupils are equal, round, and reactive to light.  Cardiovascular:     Rate and Rhythm: Normal rate and regular rhythm.     Heart sounds: Normal heart sounds.  Pulmonary:     Effort: Pulmonary effort is normal. No respiratory distress.     Breath sounds: Normal breath sounds.     Comments: Lungs are clear Musculoskeletal:        General: Normal range of motion.     Cervical back: Normal range of motion.  Skin:    General: Skin is warm and dry.     Findings: Erythema present.  Neurological:     Mental Status: She is alert.  Psychiatric:        Mood and Affect: Mood normal.        Behavior: Behavior normal.      UC Treatments / Results  Labs (all labs ordered are listed, but only abnormal results are displayed) Labs Reviewed - No data to display  EKG   Radiology CT Abdomen Pelvis W Contrast  Result Date: 02/26/2020 CLINICAL DATA:  Abdominal pain worse moving. EXAM: CT ABDOMEN AND PELVIS WITH CONTRAST TECHNIQUE: Multidetector CT imaging of the abdomen and pelvis was performed using the standard protocol following bolus administration of intravenous contrast. CONTRAST:  OMNIPAQUE IOHEXOL 300 MG/ML  SOLN COMPARISON:  None. FINDINGS: Lower chest: No acute abnormality. Hepatobiliary: 1.3 x 2.1 cm hypodense, fluid  attenuating  right hepatic mass consistent with a cyst. Similar smaller subcentimeter hypodensity in the inferior right hepatic mass likely reflecting a cyst. Prior cholecystectomy. No intrahepatic or extrahepatic biliary ductal dilatation. Pancreas: Unremarkable. No pancreatic ductal dilatation or surrounding inflammatory changes. Spleen: Normal in size without focal abnormality. Adrenals/Urinary Tract: Adrenal glands are unremarkable. Kidneys are normal, without renal calculi, focal lesion, or hydronephrosis. Bladder is unremarkable. Stomach/Bowel: Stomach is within normal limits. Appendix appears normal. No evidence of bowel wall thickening, distention, or inflammatory changes. Vascular/Lymphatic: Normal caliber abdominal aorta with mild atherosclerosis. No lymphadenopathy. Reproductive: Uterus and bilateral adnexa are unremarkable. Other: No abdominal wall hernia or abnormality. No abdominopelvic ascites. Musculoskeletal: No acute osseous abnormality. No aggressive osseous lesion. Degenerative disease with disc height loss at L5-S1. mild osteoarthritis of bilateral SI joints. IMPRESSION: No acute abdominal or pelvic pathology. Electronically Signed   By: Kathreen Devoid   On: 02/26/2020 15:39    Procedures Procedures (including critical care time)  Medications Ordered in UC Medications  methylPREDNISolone sodium succinate (SOLU-MEDROL) 125 mg/2 mL injection 80 mg (80 mg Intramuscular Given 02/27/20 1821)    Initial Impression / Assessment and Plan / UC Course  I have reviewed the triage vital signs and the nursing notes.  Pertinent labs & imaging results that were available during my care of the patient were reviewed by me and considered in my medical decision making (see chart for details).     Allergic rash.  Possible delayed reaction from Omnipaque yesterday.  Will treat with steroids and watch Final Clinical Impressions(s) / UC Diagnoses   Final diagnoses:  Allergic rash present on  examination     Discharge Instructions     Take the Medrol Dosepak as directed Start the Medrol tomorrow Take Benadryl tonight for itching If you still itching tomorrow morning takes Zyrtec or Claritin You should see improvement pretty quickly If you have worsening rash or shortness of breath, go to the ER   ED Prescriptions    Medication Sig Dispense Auth. Provider   methylPREDNISolone (MEDROL DOSEPAK) 4 MG TBPK tablet tad 21 tablet Raylene Everts, MD     PDMP not reviewed this encounter.   Raylene Everts, MD 02/27/20 [phone]

## 2020-02-27 NOTE — Discharge Instructions (Addendum)
Take the Medrol Dosepak as directed Start the Medrol tomorrow Take Benadryl tonight for itching If you still itching tomorrow morning takes Zyrtec or Claritin You should see improvement pretty quickly If you have worsening rash or shortness of breath, go to the ER

## 2020-02-27 NOTE — ED Triage Notes (Addendum)
Pt is here with trouble breathing that started an hour ago, pt has a rash all over her body that started yesterday she thinks it could be an allergic reaction to Iodine that she was used on her yesterday at the ED. Pt has not taken anything to relieve discomfort. Pt states she only feel SOB only when walking short distances, pt confirms smoking but denies chest pain.

## 2020-03-28 ENCOUNTER — Ambulatory Visit (HOSPITAL_COMMUNITY)
Admission: EM | Admit: 2020-03-28 | Discharge: 2020-03-28 | Disposition: A | Discharge status: Home / Self Care | Payer: Medicaid Other | Attending: Family Medicine | Admitting: Family Medicine

## 2020-03-28 ENCOUNTER — Encounter (HOSPITAL_COMMUNITY): Payer: Self-pay | Admitting: Emergency Medicine

## 2020-03-28 ENCOUNTER — Other Ambulatory Visit: Payer: Self-pay

## 2020-03-28 DIAGNOSIS — R05 Cough: Secondary | ICD-10-CM | POA: Diagnosis present

## 2020-03-28 DIAGNOSIS — B349 Viral infection, unspecified: Secondary | ICD-10-CM | POA: Insufficient documentation

## 2020-03-28 DIAGNOSIS — F17210 Nicotine dependence, cigarettes, uncomplicated: Secondary | ICD-10-CM | POA: Diagnosis not present

## 2020-03-28 DIAGNOSIS — R197 Diarrhea, unspecified: Secondary | ICD-10-CM | POA: Insufficient documentation

## 2020-03-28 DIAGNOSIS — I10 Essential (primary) hypertension: Secondary | ICD-10-CM | POA: Diagnosis not present

## 2020-03-28 DIAGNOSIS — J45909 Unspecified asthma, uncomplicated: Secondary | ICD-10-CM | POA: Insufficient documentation

## 2020-03-28 DIAGNOSIS — Z7901 Long term (current) use of anticoagulants: Secondary | ICD-10-CM | POA: Insufficient documentation

## 2020-03-28 DIAGNOSIS — Z79899 Other long term (current) drug therapy: Secondary | ICD-10-CM | POA: Diagnosis not present

## 2020-03-28 DIAGNOSIS — R519 Headache, unspecified: Secondary | ICD-10-CM | POA: Insufficient documentation

## 2020-03-28 DIAGNOSIS — Z20822 Contact with and (suspected) exposure to covid-19: Secondary | ICD-10-CM | POA: Diagnosis not present

## 2020-03-28 MED ORDER — BENZONATATE 100 MG PO CAPS
100.0000 mg | ORAL_CAPSULE | Freq: Three times a day (TID) | ORAL | 0 refills | Status: DC
Start: 2020-03-28 — End: 2020-11-01

## 2020-03-28 MED ORDER — LOPERAMIDE HCL 2 MG PO CAPS
2.0000 mg | ORAL_CAPSULE | Freq: Four times a day (QID) | ORAL | 0 refills | Status: AC | PRN
Start: 2020-03-28 — End: ?

## 2020-03-28 MED ORDER — ALBUTEROL SULFATE HFA 108 (90 BASE) MCG/ACT IN AERS: 1.0000 | INHALATION_SPRAY | Freq: Four times a day (QID) | RESPIRATORY_TRACT | 0 refills | Status: DC | PRN

## 2020-03-28 MED ORDER — FLUTICASONE PROPIONATE 50 MCG/ACT NA SUSP: 1.0000 | Freq: Every day | NASAL | 0 refills | Status: AC

## 2020-03-28 MED ORDER — ACETAMINOPHEN 325 MG PO TABS
650.0000 mg | ORAL_TABLET | Freq: Four times a day (QID) | ORAL | 0 refills | Status: AC | PRN
Start: 2020-03-28 — End: ?

## 2020-03-28 NOTE — ED Triage Notes (Signed)
Pt here for body aches, diarrhea, cough and asthma sx x 3 days

## 2020-03-28 NOTE — Discharge Instructions (Signed)
Take the medicines as prescribed   Drink plenty of water and pedialyte  Use albuterol as needed  If you feel you are worsening with shortness of breath, severe diarrhea or cannot tolerate oral intake go to the ED  If your Covid-19 test is positive, you will receive a phone call from [facility] regarding your results. Negative test results are not called. Both positive and negative results area always visible on MyChart. If you do not have a MyChart account, sign up instructions are in your discharge papers.   Persons who are directed to care for themselves at home may discontinue isolation under the following conditions:   At least 10 days have passed since symptom onset and  At least 24 hours have passed without running a fever (this means without the use of fever-reducing medications) and  Other symptoms have improved.  Persons infected with COVID-19 who never develop symptoms may discontinue isolation and other precautions 10 days after the date of their first positive COVID-19 test.

## 2020-03-28 NOTE — ED Provider Notes (Signed)
[facility]    CSN: [id number] Arrival date & time: 03/28/20  1741      History   Chief Complaint Chief Complaint  Patient presents with  . Cough  . Diarrhea  . Generalized Body Aches    HPI Sheila Garner is a 44 y.o. female.   Patient with history of asthma reports for a 3-day history of body ache, diarrhea, runny nose and cough.  She reports symptoms started with nasal congestion initially.  Since then she has developed a dry cough.  She reports some discomfort with the cough in her lower chest.  Denies chest pain at rest.  She does endorse some shortness of breath responds well to her inhaler.  She reports she has had 4-5 episodes of loose stool.  These seem to come with meals.  She reports possible slight improvement in this over the last few days.  Denies any blood in the stool.  She has had some nausea on and off.  She has had few episodes of vomiting after coughing but this has not been common.  She has been drinking water.  She has eaten small meals.  Denies fever but endorses body ache and chills.  Slight headache.  Sore throat but this is mild.  No difficulty swallowing.  She reports her grandchildren had a viral illness she believes is where she got this from.  She reports no change in her urinary patterns, without painful urination or frequency.  She has not had any belly pain.     Past Medical History:  Diagnosis Date  . Asthma   . Hypertension     Patient Active Problem List   Diagnosis Date Noted  . Sprain of metacarpophalangeal joint of right little finger 10/17/2018  . Closed fracture of tuft of distal phalanx of finger 08/02/2018    Past Surgical History:  Procedure Laterality Date  . CHOLECYSTECTOMY  2012  . KNEE SURGERY Left     OB History   No obstetric history on file.      Home Medications    Prior to Admission medications   Medication Sig Start Date End Date Taking? Authorizing Provider  acetaminophen (TYLENOL) 325 MG tablet  Take 2 tablets (650 mg total) by mouth every 6 (six) hours as needed. 03/28/20   Dillan Lunden, Veryl Speak, PA-C  albuterol (VENTOLIN HFA) 108 (90 Base) MCG/ACT inhaler Inhale 1-2 puffs into the lungs every 6 (six) hours as needed for wheezing or shortness of breath. 03/28/20   Jeanie Mccard, Veryl Speak, PA-C  AMITIZA 24 MCG capsule Take 24 mcg by mouth 2 (two) times daily. 02/24/20   [provider]  amLODipine (NORVASC) 5 MG tablet Take 1 tablet (5 mg total) by mouth daily. 06/18/18   Wallis Bamberg, PA-C  benzonatate (TESSALON) 100 MG capsule Take 1 capsule (100 mg total) by mouth every 8 (eight) hours. 03/28/20   Osie Merkin, Veryl Speak, PA-C  FLUoxetine (PROZAC) 40 MG capsule Take 40 mg by mouth daily. 02/10/20   [provider]  fluticasone (FLONASE) 50 MCG/ACT nasal spray Place 1 spray into both nostrils daily. 03/28/20   Daesha Insco, Veryl Speak, PA-C  ibuprofen (ADVIL,MOTRIN) 200 MG tablet Take 200 mg by mouth every 6 (six) hours as needed for pain.    [provider]  lisinopril-hydrochlorothiazide (ZESTORETIC) 10-12.5 MG tablet Take 1 tablet by mouth daily. 02/10/20   [provider]  loperamide (IMODIUM) 2 MG capsule Take 1 capsule (2 mg total) by mouth 4 (four) times daily as needed  for diarrhea or loose stools. 03/28/20   Timtohy Broski, Veryl Speak, PA-C  methylPREDNISolone (MEDROL DOSEPAK) 4 MG TBPK tablet tad 02/27/20   Eustace Moore, MD  ondansetron (ZOFRAN ODT) 4 MG disintegrating tablet Take 1 tablet (4 mg total) by mouth every 8 (eight) hours as needed for nausea or vomiting. 01/16/20   Dahlia Byes A, NP  sucralfate (CARAFATE) 1 g tablet Take by mouth. 03/19/19   [provider]  topiramate (TOPAMAX) 50 MG tablet Take 50 mg by mouth daily.    [provider]  lisinopril (PRINIVIL,ZESTRIL) 10 MG tablet Take 1 tablet (10 mg total) by mouth daily. 09/06/17 02/27/20  Elvina Sidle, MD    Family History Family History  Problem Relation Age of Onset  . Heart failure Mother   . Diabetes Mother   .  Hypertension Mother   . Diabetes Father   . Hypertension Father   . Stroke Father   . Breast cancer Maternal Aunt 60  . Breast cancer Maternal Grandmother 22    Social History Social History   Tobacco Use  . Smoking status: Current Every Day Smoker    Packs/day: 0.25    Years: 15.00    Pack years: 3.75    Types: Cigarettes  . Smokeless tobacco: Never Used  Vaping Use  . Vaping Use: Never used  Substance Use Topics  . Alcohol use: Yes    Comment: socially  . Drug use: No     Allergies   Red dye and Latex   Review of Systems Review of Systems   Physical Exam Triage Vital Signs ED Triage Vitals [03/28/20 1820]  Enc Vitals Group     BP (!) 144/79     Pulse Rate 98     Resp 18     Temp 99.2 F (37.3 C)     Temp Source Oral     SpO2 99 %     Weight      Height      Head Circumference      Peak Flow      Pain Score 7     Pain Loc      Pain Edu?      Excl. in GC?    No data found.  Updated Vital Signs BP (!) 144/79 (BP Location: Right Arm)   Pulse 98   Temp 99.2 F (37.3 C) (Oral)   Resp 18   SpO2 99%   Visual Acuity Right Eye Distance:   Left Eye Distance:   Bilateral Distance:    Right Eye Near:   Left Eye Near:    Bilateral Near:     Physical Exam Vitals and nursing note reviewed.  Constitutional:      General: She is not in acute distress.    Appearance: She is well-developed. She is not ill-appearing.  HENT:     Head: Normocephalic and atraumatic.     Nose: Congestion and rhinorrhea present.     Mouth/Throat:     Pharynx: No oropharyngeal exudate or posterior oropharyngeal erythema.     Comments: Postnasal drip Eyes:     Extraocular Movements: Extraocular movements intact.     Conjunctiva/sclera: Conjunctivae normal.     Pupils: Pupils are equal, round, and reactive to light.  Cardiovascular:     Rate and Rhythm: Normal rate and regular rhythm.     Heart sounds: No murmur heard.   Pulmonary:     Effort: Pulmonary effort is  normal. No respiratory distress.  Breath sounds: Normal breath sounds. No wheezing, rhonchi or rales.     Comments: Speaking in clear full sentences.  Saturating 99% on room air.  No accessory muscle use.  Auscultated in 14 fields on the back and 6 in the front. Abdominal:     Palpations: Abdomen is soft.     Tenderness: There is no abdominal tenderness. There is no right CVA tenderness or left CVA tenderness.  Musculoskeletal:     Cervical back: Neck supple.     Right lower leg: No edema.     Left lower leg: No edema.  Lymphadenopathy:     Cervical: No cervical adenopathy.  Skin:    General: Skin is warm and dry.  Neurological:     General: No focal deficit present.     Mental Status: She is alert and oriented to person, place, and time.      UC Treatments / Results  Labs (all labs ordered are listed, but only abnormal results are displayed) Labs Reviewed  SARS CORONAVIRUS 2 (TAT 6-24 HRS)    EKG   Radiology No results found.  Procedures Procedures (including critical care time)  Medications Ordered in UC Medications - No data to display  Initial Impression / Assessment and Plan / UC Course  I have reviewed the triage vital signs and the nursing notes.  Pertinent labs & imaging results that were available during my care of the patient were reviewed by me and considered in my medical decision making (see chart for details).     #Viral illness Patient is a 44 year old with history of asthma presenting with a viral illness.  Suspicious for Covid given respiratory and GI symptoms.  Covid PCR sent.  She is afebrile with stable vital signs in clinic.  Reassuring exam.  We will treat symptomatically.  Will refill albuterol inhaler for as needed, not wheezing here today.  Discussed use of loperamide to aid in minimizing interruptions in sleep, with precautions for development of any severe abdominal pain.  Tessalon for cough, Flonase for nasal signs and symptoms.  Tylenol  for chills and body ache.  Emergency department and return precautions were discussed.  Patient verbalized understanding and agreed with the plan. Final Clinical Impressions(s) / UC Diagnoses   Final diagnoses:  Viral illness     Discharge Instructions     Take the medicines as prescribed   Drink plenty of water and pedialyte  Use albuterol as needed  If you feel you are worsening with shortness of breath, severe diarrhea or cannot tolerate oral intake go to the ED  If your Covid-19 test is positive, you will receive a phone call from [facility] regarding your results. Negative test results are not called. Both positive and negative results area always visible on MyChart. If you do not have a MyChart account, sign up instructions are in your discharge papers.   Persons who are directed to care for themselves at home may discontinue isolation under the following conditions:  . At least 10 days have passed since symptom onset and . At least 24 hours have passed without running a fever (this means without the use of fever-reducing medications) and . Other symptoms have improved.  Persons infected with COVID-19 who never develop symptoms may discontinue isolation and other precautions 10 days after the date of their first positive COVID-19 test.      ED Prescriptions    Medication Sig Dispense Auth. Provider   benzonatate (TESSALON) 100 MG capsule Take 1 capsule (100  mg total) by mouth every 8 (eight) hours. 21 capsule Thurmon Mizell, Marguerita Beards, PA-C   albuterol (VENTOLIN HFA) 108 (90 Base) MCG/ACT inhaler Inhale 1-2 puffs into the lungs every 6 (six) hours as needed for wheezing or shortness of breath. 8 g Fatema Rabe, Marguerita Beards, PA-C   fluticasone (FLONASE) 50 MCG/ACT nasal spray Place 1 spray into both nostrils daily. 11.1 mL Damonte Frieson, Marguerita Beards, PA-C   acetaminophen (TYLENOL) 325 MG tablet Take 2 tablets (650 mg total) by mouth every 6 (six) hours as needed. 30 tablet Chilton Sallade, Marguerita Beards, PA-C   loperamide  (IMODIUM) 2 MG capsule Take 1 capsule (2 mg total) by mouth 4 (four) times daily as needed for diarrhea or loose stools. 12 capsule Elbony Mcclimans, Marguerita Beards, PA-C     PDMP not reviewed this encounter.   Purnell Shoemaker, PA-C 03/28/20 1909

## 2020-03-30 LAB — SARS CORONAVIRUS 2 (TAT 6-24 HRS): SARS Coronavirus 2: NEGATIVE

## 2020-05-23 IMAGING — DX PORTABLE CHEST - 1 VIEW
1 series · 1 of 1 positions shown · non-contrast
Comparison: 06/23/2018

CLINICAL DATA: Sore throat.

EXAM:
PORTABLE CHEST 1 VIEW

[chest ap]
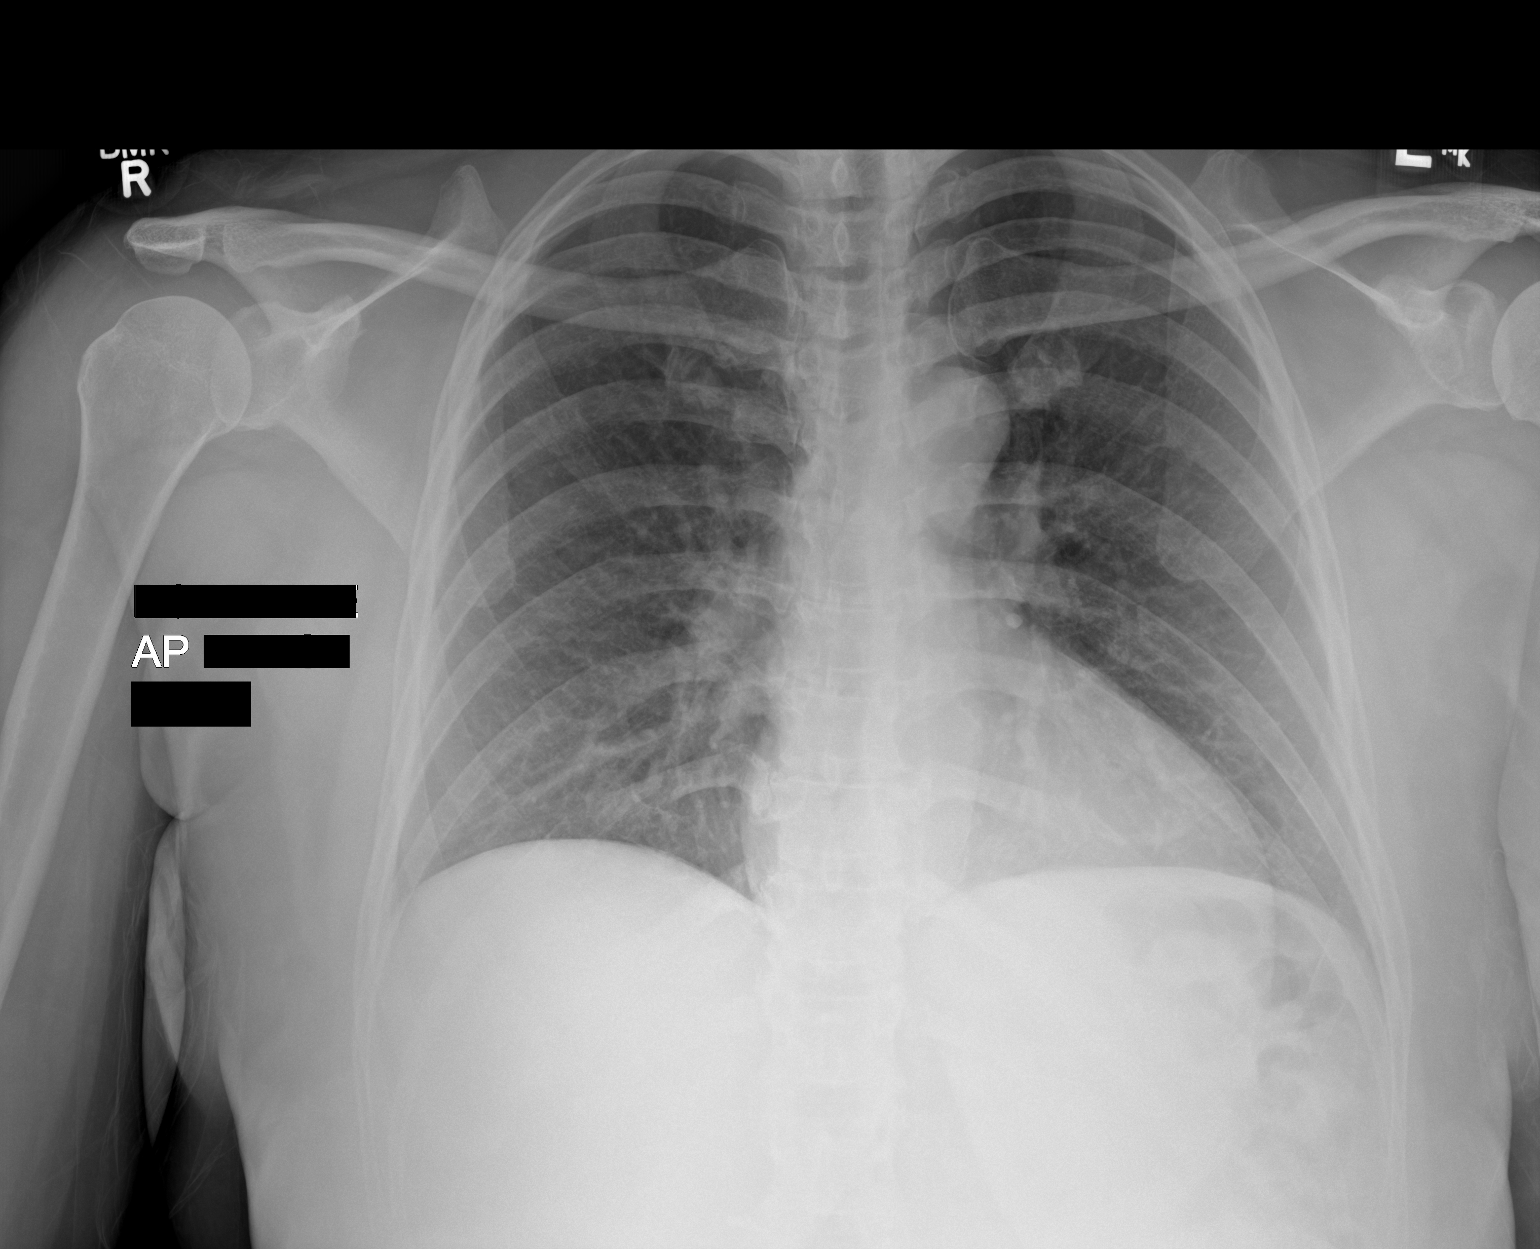

[1 of 1 positions shown; findings below may reference images not displayed]

FINDINGS: The heart size and mediastinal contours are within normal limits.
Both lungs are clear. The visualized skeletal structures are
unremarkable.
IMPRESSION: No active disease.

## 2020-11-01 ENCOUNTER — Ambulatory Visit
Admission: EM | Admit: 2020-11-01 | Discharge: 2020-11-01 | Disposition: A | Discharge status: Home / Self Care | Payer: Medicaid Other | Attending: Urgent Care | Admitting: Urgent Care

## 2020-11-01 ENCOUNTER — Encounter: Payer: Self-pay | Admitting: Emergency Medicine

## 2020-11-01 ENCOUNTER — Other Ambulatory Visit: Payer: Self-pay

## 2020-11-01 DIAGNOSIS — R0602 Shortness of breath: Secondary | ICD-10-CM

## 2020-11-01 DIAGNOSIS — R059 Cough, unspecified: Secondary | ICD-10-CM

## 2020-11-01 DIAGNOSIS — B9789 Other viral agents as the cause of diseases classified elsewhere: Secondary | ICD-10-CM | POA: Diagnosis not present

## 2020-11-01 DIAGNOSIS — J988 Other specified respiratory disorders: Secondary | ICD-10-CM

## 2020-11-01 DIAGNOSIS — R0789 Other chest pain: Secondary | ICD-10-CM

## 2020-11-01 DIAGNOSIS — F17200 Nicotine dependence, unspecified, uncomplicated: Secondary | ICD-10-CM

## 2020-11-01 HISTORY — DX: Pneumonia, unspecified organism: J18.9

## 2020-11-01 MED ORDER — PSEUDOEPHEDRINE HCL 60 MG PO TABS
60.0000 mg | ORAL_TABLET | Freq: Three times a day (TID) | ORAL | 0 refills | Status: AC | PRN
Start: 2020-11-01 — End: ?

## 2020-11-01 MED ORDER — CETIRIZINE HCL 10 MG PO TABS: 10.0000 mg | ORAL_TABLET | Freq: Every day | ORAL | 0 refills | Status: AC

## 2020-11-01 MED ORDER — PROMETHAZINE-DM 6.25-15 MG/5ML PO SYRP: 5.0000 mL | ORAL_SOLUTION | Freq: Every evening | ORAL | 0 refills | Status: AC | PRN

## 2020-11-01 MED ORDER — BENZONATATE 100 MG PO CAPS: 100.0000 mg | ORAL_CAPSULE | Freq: Three times a day (TID) | ORAL | 0 refills | Status: AC | PRN

## 2020-11-01 MED ORDER — ALBUTEROL SULFATE HFA 108 (90 BASE) MCG/ACT IN AERS: 1.0000 | INHALATION_SPRAY | Freq: Four times a day (QID) | RESPIRATORY_TRACT | 0 refills | Status: AC | PRN

## 2020-11-01 NOTE — Discharge Instructions (Signed)

## 2020-11-01 NOTE — ED Provider Notes (Signed)
[facility]   MRN: [id number] DOB: 14-Jul-1976  Subjective:   Sheila Garner is a 45 y.o. female presenting for 2-day history of cute onset sinus congestion, coughing, shortness of breath and body aches.  She has also had some loose stools.  Patient generally gets intermittent chest pain, has a history of asthma and states that her chest pain is no different. No COVID vaccination.  Patient is also a smoker.  No current facility-administered medications for this encounter.  Current Outpatient Medications:  .  acetaminophen (TYLENOL) 325 MG tablet, Take 2 tablets (650 mg total) by mouth every 6 (six) hours as needed., Disp: 30 tablet, Rfl: 0 .  albuterol (VENTOLIN HFA) 108 (90 Base) MCG/ACT inhaler, Inhale 1-2 puffs into the lungs every 6 (six) hours as needed for wheezing or shortness of breath., Disp: 8 g, Rfl: 0 .  AMITIZA 24 MCG capsule, Take 24 mcg by mouth 2 (two) times daily., Disp: , Rfl:  .  fluticasone (FLONASE) 50 MCG/ACT nasal spray, Place 1 spray into both nostrils daily., Disp: 11.1 mL, Rfl: 0 .  ibuprofen (ADVIL,MOTRIN) 200 MG tablet, Take 200 mg by mouth every 6 (six) hours as needed for pain., Disp: , Rfl:  .  lisinopril-hydrochlorothiazide (ZESTORETIC) 10-12.5 MG tablet, Take 1 tablet by mouth daily., Disp: , Rfl:  .  loperamide (IMODIUM) 2 MG capsule, Take 1 capsule (2 mg total) by mouth 4 (four) times daily as needed for diarrhea or loose stools., Disp: 12 capsule, Rfl: 0 .  ondansetron (ZOFRAN ODT) 4 MG disintegrating tablet, Take 1 tablet (4 mg total) by mouth every 8 (eight) hours as needed for nausea or vomiting., Disp: 20 tablet, Rfl: 0 .  topiramate (TOPAMAX) 50 MG tablet, Take 50 mg by mouth daily., Disp: , Rfl:  .  amLODipine (NORVASC) 5 MG tablet, Take 1 tablet (5 mg total) by mouth daily., Disp: 90 tablet, Rfl: 0 .  benzonatate (TESSALON) 100 MG capsule, Take 1 capsule (100 mg total) by mouth every 8 (eight) hours., Disp: 21 capsule, Rfl: 0 .   FLUoxetine (PROZAC) 40 MG capsule, Take 40 mg by mouth daily., Disp: , Rfl:  .  methylPREDNISolone (MEDROL DOSEPAK) 4 MG TBPK tablet, tad, Disp: 21 tablet, Rfl: 0 .  sucralfate (CARAFATE) 1 g tablet, Take by mouth., Disp: , Rfl:    Allergies  Allergen Reactions  . Red Dye   . Iodine Rash  . Latex Rash    Past Medical History:  Diagnosis Date  . Asthma   . Hypertension   . Pneumonia      Past Surgical History:  Procedure Laterality Date  . CHOLECYSTECTOMY  2012  . KNEE SURGERY Left     Family History  Problem Relation Age of Onset  . Heart failure Mother   . Diabetes Mother   . Hypertension Mother   . Diabetes Father   . Hypertension Father   . Stroke Father   . Breast cancer Maternal Aunt 60  . Breast cancer Maternal Grandmother 62    Social History   Tobacco Use  . Smoking status: Current Every Day Smoker    Packs/day: 0.25    Years: 15.00    Pack years: 3.75    Types: Cigarettes  . Smokeless tobacco: Never Used  Vaping Use  . Vaping Use: Never used  Substance Use Topics  . Alcohol use: Yes    Comment: socially  . Drug use: No    ROS   Objective:   Vitals: BP Marland Kitchen)  141/84 (BP Location: Right Arm)   Pulse 74   Temp 98.1 F (36.7 C) (Oral)   Resp 16   LMP 10/19/2020 (Exact Date)   SpO2 100%   Physical Exam Constitutional:      General: She is not in acute distress.    Appearance: Normal appearance. She is well-developed. She is not ill-appearing, toxic-appearing or diaphoretic.  HENT:     Head: Normocephalic and atraumatic.     Nose: Nose normal.     Mouth/Throat:     Mouth: Mucous membranes are moist.  Eyes:     Extraocular Movements: Extraocular movements intact.     Pupils: Pupils are equal, round, and reactive to light.  Cardiovascular:     Rate and Rhythm: Normal rate and regular rhythm.     Pulses: Normal pulses.     Heart sounds: Normal heart sounds. No murmur heard. No friction rub. No gallop.   Pulmonary:     Effort: Pulmonary  effort is normal. No respiratory distress.     Breath sounds: Normal breath sounds. No stridor. No wheezing, rhonchi or rales.  Skin:    General: Skin is warm and dry.     Findings: No rash.  Neurological:     Mental Status: She is alert and oriented to person, place, and time.  Psychiatric:        Mood and Affect: Mood normal.        Behavior: Behavior normal.        Thought Content: Thought content normal.     Assessment and Plan :   PDMP not reviewed this encounter.  1. Viral respiratory illness   2. Cough   3. Shortness of breath   4. Atypical chest pain   5. Smoking     Will manage for viral illness such as viral URI, viral syndrome, viral rhinitis, COVID-19. Counseled patient on nature of COVID-19 including modes of transmission, diagnostic testing, management and supportive care.  Offered scripts for symptomatic relief. COVID 19 testing is pending. Counseled patient on potential for adverse effects with medications prescribed/recommended today, ER and return-to-clinic precautions discussed, patient verbalized understanding.     Wallis Bamberg, PA-C 11/01/20 1451

## 2020-11-01 NOTE — ED Triage Notes (Addendum)
Pt presents today with cough, nasal congestion, SOB, body aches and diarrhea x 2 days.

## 2020-11-02 LAB — NOVEL CORONAVIRUS, NAA: SARS-CoV-2, NAA: DETECTED — AB

## 2020-11-02 LAB — SARS-COV-2, NAA 2 DAY TAT

## 2020-11-03 ENCOUNTER — Telehealth: Payer: Self-pay

## 2020-11-03 NOTE — Telephone Encounter (Signed)
Called to discuss with patient about COVID-19 symptoms and the use of one of the available treatments for those with mild to moderate Covid symptoms and at a high risk of hospitalization.  Pt appears to qualify for outpatient treatment due to co-morbid conditions and/or a member of an at-risk group in accordance with the FDA Emergency Use Authorization.    Symptom onset: 10/27/20 Vaccinated: No Booster? No Immunocompromised? No Qualifiers: HTN,Asthma  Unable to reach pt - Reached  Pt. Is out of 7 day window. Will reach out to her PCP for continues sinus pain and pressure. Sheila Garner
